# Patient Record
Sex: Male | Born: 1935 | Race: White | Hispanic: No | Marital: Married | Smoking: Former smoker
Health system: Southern US, Community
[De-identification: ages and names within clinical notes are randomized; demographics above are authoritative.]

## PROBLEM LIST (undated history)

## (undated) DIAGNOSIS — F0280 Dementia in other diseases classified elsewhere without behavioral disturbance: Secondary | ICD-10-CM

## (undated) DIAGNOSIS — F0390 Unspecified dementia without behavioral disturbance: Secondary | ICD-10-CM

## (undated) DIAGNOSIS — I82409 Acute embolism and thrombosis of unspecified deep veins of unspecified lower extremity: Secondary | ICD-10-CM

## (undated) DIAGNOSIS — I639 Cerebral infarction, unspecified: Secondary | ICD-10-CM

## (undated) DIAGNOSIS — C801 Malignant (primary) neoplasm, unspecified: Secondary | ICD-10-CM

## (undated) DIAGNOSIS — Z789 Other specified health status: Secondary | ICD-10-CM

## (undated) DIAGNOSIS — I10 Essential (primary) hypertension: Secondary | ICD-10-CM

## (undated) DIAGNOSIS — G309 Alzheimer's disease, unspecified: Secondary | ICD-10-CM

## (undated) HISTORY — PX: HERNIA REPAIR: SHX51

---

## 2003-11-13 ENCOUNTER — Other Ambulatory Visit: Payer: Self-pay

## 2003-11-15 ENCOUNTER — Other Ambulatory Visit: Payer: Self-pay

## 2004-01-20 ENCOUNTER — Ambulatory Visit: Payer: Self-pay | Admitting: Oncology

## 2004-02-16 ENCOUNTER — Ambulatory Visit: Payer: Self-pay | Admitting: Oncology

## 2004-02-20 ENCOUNTER — Ambulatory Visit: Payer: Self-pay | Admitting: Oncology

## 2004-03-21 ENCOUNTER — Ambulatory Visit: Payer: Self-pay | Admitting: Oncology

## 2004-04-21 ENCOUNTER — Ambulatory Visit: Payer: Self-pay | Admitting: Oncology

## 2004-05-22 ENCOUNTER — Ambulatory Visit: Payer: Self-pay | Admitting: Oncology

## 2004-05-31 ENCOUNTER — Ambulatory Visit: Payer: Self-pay | Admitting: Oncology

## 2004-09-04 ENCOUNTER — Ambulatory Visit: Payer: Self-pay | Admitting: Oncology

## 2004-09-19 ENCOUNTER — Ambulatory Visit: Payer: Self-pay | Admitting: Oncology

## 2004-10-02 ENCOUNTER — Other Ambulatory Visit: Payer: Self-pay

## 2004-10-02 ENCOUNTER — Emergency Department: Payer: Self-pay | Admitting: Unknown Physician Specialty

## 2004-12-04 ENCOUNTER — Ambulatory Visit: Payer: Self-pay | Admitting: Oncology

## 2004-12-20 ENCOUNTER — Ambulatory Visit: Payer: Self-pay | Admitting: Oncology

## 2005-03-07 ENCOUNTER — Ambulatory Visit: Payer: Self-pay | Admitting: Oncology

## 2005-06-26 ENCOUNTER — Ambulatory Visit: Payer: Self-pay | Admitting: Oncology

## 2005-07-20 ENCOUNTER — Ambulatory Visit: Payer: Self-pay | Admitting: Oncology

## 2005-10-14 ENCOUNTER — Encounter: Payer: Self-pay | Admitting: Internal Medicine

## 2005-10-15 ENCOUNTER — Ambulatory Visit: Payer: Self-pay | Admitting: Oncology

## 2005-10-19 ENCOUNTER — Encounter: Payer: Self-pay | Admitting: Internal Medicine

## 2005-11-07 ENCOUNTER — Ambulatory Visit: Payer: Self-pay | Admitting: Oncology

## 2005-11-19 ENCOUNTER — Encounter: Payer: Self-pay | Admitting: Internal Medicine

## 2005-12-04 ENCOUNTER — Ambulatory Visit: Payer: Self-pay | Admitting: Oncology

## 2005-12-26 ENCOUNTER — Ambulatory Visit: Payer: Self-pay | Admitting: Oncology

## 2006-01-19 ENCOUNTER — Ambulatory Visit: Payer: Self-pay | Admitting: Oncology

## 2006-05-29 ENCOUNTER — Ambulatory Visit: Payer: Self-pay | Admitting: Oncology

## 2006-06-20 ENCOUNTER — Ambulatory Visit: Payer: Self-pay | Admitting: Oncology

## 2006-08-12 ENCOUNTER — Ambulatory Visit: Payer: Self-pay | Admitting: Internal Medicine

## 2007-08-11 ENCOUNTER — Encounter: Payer: Self-pay | Admitting: Internal Medicine

## 2007-08-20 ENCOUNTER — Encounter: Payer: Self-pay | Admitting: Internal Medicine

## 2007-09-20 ENCOUNTER — Encounter: Payer: Self-pay | Admitting: Internal Medicine

## 2007-10-20 ENCOUNTER — Encounter: Payer: Self-pay | Admitting: Internal Medicine

## 2008-01-20 ENCOUNTER — Inpatient Hospital Stay: Payer: Self-pay | Admitting: Internal Medicine

## 2008-05-24 ENCOUNTER — Emergency Department: Payer: Self-pay | Admitting: Emergency Medicine

## 2009-01-07 ENCOUNTER — Emergency Department: Payer: Self-pay | Admitting: Unknown Physician Specialty

## 2009-09-21 ENCOUNTER — Ambulatory Visit: Payer: Self-pay | Admitting: Internal Medicine

## 2010-03-26 ENCOUNTER — Emergency Department: Payer: Self-pay | Admitting: Emergency Medicine

## 2010-04-19 ENCOUNTER — Inpatient Hospital Stay: Payer: Self-pay | Admitting: Internal Medicine

## 2010-09-20 ENCOUNTER — Inpatient Hospital Stay: Payer: Self-pay | Admitting: Internal Medicine

## 2010-11-20 ENCOUNTER — Inpatient Hospital Stay: Payer: Self-pay | Admitting: Internal Medicine

## 2011-03-04 ENCOUNTER — Inpatient Hospital Stay: Payer: Self-pay | Admitting: Internal Medicine

## 2011-05-26 ENCOUNTER — Emergency Department (HOSPITAL_COMMUNITY): Payer: Medicare Other

## 2011-05-26 ENCOUNTER — Inpatient Hospital Stay (HOSPITAL_COMMUNITY)
Admission: EM | Admit: 2011-05-26 | Discharge: 2011-05-27 | DRG: 640 | Disposition: A | Discharge status: Home / Self Care | Payer: Medicare Other | Attending: Internal Medicine | Admitting: Internal Medicine

## 2011-05-26 ENCOUNTER — Other Ambulatory Visit: Payer: Self-pay

## 2011-05-26 ENCOUNTER — Encounter (HOSPITAL_COMMUNITY): Payer: Self-pay | Admitting: Neurology

## 2011-05-26 DIAGNOSIS — Z993 Dependence on wheelchair: Secondary | ICD-10-CM

## 2011-05-26 DIAGNOSIS — F0390 Unspecified dementia without behavioral disturbance: Secondary | ICD-10-CM

## 2011-05-26 DIAGNOSIS — Z66 Do not resuscitate: Secondary | ICD-10-CM | POA: Diagnosis present

## 2011-05-26 DIAGNOSIS — E119 Type 2 diabetes mellitus without complications: Secondary | ICD-10-CM | POA: Diagnosis present

## 2011-05-26 DIAGNOSIS — I10 Essential (primary) hypertension: Secondary | ICD-10-CM | POA: Diagnosis present

## 2011-05-26 DIAGNOSIS — G92 Toxic encephalopathy: Secondary | ICD-10-CM | POA: Diagnosis present

## 2011-05-26 DIAGNOSIS — E860 Dehydration: Principal | ICD-10-CM | POA: Diagnosis present

## 2011-05-26 DIAGNOSIS — F0280 Dementia in other diseases classified elsewhere without behavioral disturbance: Secondary | ICD-10-CM | POA: Diagnosis present

## 2011-05-26 DIAGNOSIS — R4182 Altered mental status, unspecified: Secondary | ICD-10-CM | POA: Diagnosis present

## 2011-05-26 DIAGNOSIS — G309 Alzheimer's disease, unspecified: Secondary | ICD-10-CM | POA: Diagnosis present

## 2011-05-26 DIAGNOSIS — Z7401 Bed confinement status: Secondary | ICD-10-CM

## 2011-05-26 DIAGNOSIS — Z8673 Personal history of transient ischemic attack (TIA), and cerebral infarction without residual deficits: Secondary | ICD-10-CM

## 2011-05-26 DIAGNOSIS — Z87898 Personal history of other specified conditions: Secondary | ICD-10-CM

## 2011-05-26 DIAGNOSIS — G929 Unspecified toxic encephalopathy: Secondary | ICD-10-CM | POA: Diagnosis present

## 2011-05-26 HISTORY — DX: Alzheimer's disease, unspecified: G30.9

## 2011-05-26 HISTORY — DX: Other specified health status: Z78.9

## 2011-05-26 HISTORY — DX: Malignant (primary) neoplasm, unspecified: C80.1

## 2011-05-26 HISTORY — DX: Acute embolism and thrombosis of unspecified deep veins of unspecified lower extremity: I82.409

## 2011-05-26 HISTORY — DX: Cerebral infarction, unspecified: I63.9

## 2011-05-26 HISTORY — DX: Essential (primary) hypertension: I10

## 2011-05-26 HISTORY — DX: Unspecified dementia, unspecified severity, without behavioral disturbance, psychotic disturbance, mood disturbance, and anxiety: F03.90

## 2011-05-26 HISTORY — DX: Dementia in other diseases classified elsewhere, unspecified severity, without behavioral disturbance, psychotic disturbance, mood disturbance, and anxiety: F02.80

## 2011-05-26 LAB — COMPREHENSIVE METABOLIC PANEL
ALT: 12 U/L (ref 0–53)
Albumin: 3.2 g/dL — ABNORMAL LOW (ref 3.5–5.2)
Alkaline Phosphatase: 57 U/L (ref 39–117)
BUN: 29 mg/dL — ABNORMAL HIGH (ref 6–23)
Chloride: 106 mEq/L (ref 96–112)
Potassium: 3.4 mEq/L — ABNORMAL LOW (ref 3.5–5.1)
Sodium: 146 mEq/L — ABNORMAL HIGH (ref 135–145)
Total Bilirubin: 0.5 mg/dL (ref 0.3–1.2)
Total Protein: 6.1 g/dL (ref 6.0–8.3)

## 2011-05-26 LAB — CBC
HCT: 34.7 % — ABNORMAL LOW (ref 39.0–52.0)
Hemoglobin: 11.6 g/dL — ABNORMAL LOW (ref 13.0–17.0)
MCHC: 33.3 g/dL (ref 30.0–36.0)
MCHC: 32.9 g/dL (ref 30.0–36.0)
Platelets: 146 10*3/uL — ABNORMAL LOW (ref 150–400)
Platelets: 132 10*3/uL — ABNORMAL LOW (ref 150–400)
RBC: 3.56 MIL/uL — ABNORMAL LOW (ref 4.22–5.81)
RDW: 13.8 % (ref 11.5–15.5)
WBC: 7.8 10*3/uL (ref 4.0–10.5)

## 2011-05-26 LAB — GLUCOSE, CAPILLARY: Glucose-Capillary: 161 mg/dL — ABNORMAL HIGH (ref 70–99)

## 2011-05-26 LAB — CREATININE, SERUM
Creatinine, Ser: 1.32 mg/dL (ref 0.50–1.35)
GFR calc Af Amer: 53 mL/min — ABNORMAL LOW (ref >90)
GFR calc non Af Amer: 46 mL/min — ABNORMAL LOW (ref >90)

## 2011-05-26 LAB — URINALYSIS, ROUTINE W REFLEX MICROSCOPIC
Bilirubin Urine: NEGATIVE
Glucose, UA: NEGATIVE mg/dL
Ketones, ur: NEGATIVE mg/dL
Protein, ur: 30 mg/dL — AB
pH: 7.5 (ref 5.0–8.0)

## 2011-05-26 LAB — DIFFERENTIAL
Basophils Relative: 0 % (ref 0–1)
Eosinophils Absolute: 0.1 10*3/uL (ref 0.0–0.7)
Lymphs Abs: 0.9 10*3/uL (ref 0.7–4.0)
Monocytes Relative: 5 % (ref 3–12)
Neutro Abs: 8.3 10*3/uL — ABNORMAL HIGH (ref 1.7–7.7)
Neutrophils Relative %: 85 % — ABNORMAL HIGH (ref 43–77)

## 2011-05-26 LAB — URINE MICROSCOPIC-ADD ON

## 2011-05-26 MED ORDER — POTASSIUM CHLORIDE CRYS ER 10 MEQ PO TBCR
10.0000 meq | EXTENDED_RELEASE_TABLET | Freq: Every day | ORAL | Status: DC
  Filled 2011-05-26 (×2): qty 1

## 2011-05-26 MED ORDER — INSULIN ASPART 100 UNIT/ML ~~LOC~~ SOLN
0.0000 [IU] | Freq: Three times a day (TID) | SUBCUTANEOUS | Status: DC
  Filled 2011-05-26: qty 3

## 2011-05-26 MED ORDER — MEMANTINE HCL 10 MG PO TABS
10.0000 mg | ORAL_TABLET | Freq: Two times a day (BID) | ORAL | Status: DC
  Administered 2011-05-26: 10 mg via ORAL
  Filled 2011-05-26 (×3): qty 1

## 2011-05-26 MED ORDER — SODIUM CHLORIDE 0.9 % IV SOLN
INTRAVENOUS | Status: DC
  Administered 2011-05-26 – 2011-05-27 (×2): via INTRAVENOUS

## 2011-05-26 MED ORDER — ACETAMINOPHEN 650 MG RE SUPP: 650.0000 mg | Freq: Four times a day (QID) | RECTAL | Status: DC | PRN

## 2011-05-26 MED ORDER — ALBUTEROL SULFATE (5 MG/ML) 0.5% IN NEBU: 2.5000 mg | INHALATION_SOLUTION | RESPIRATORY_TRACT | Status: DC | PRN

## 2011-05-26 MED ORDER — ONDANSETRON HCL 4 MG/2ML IJ SOLN: 4.0000 mg | Freq: Four times a day (QID) | INTRAMUSCULAR | Status: DC | PRN

## 2011-05-26 MED ORDER — AMLODIPINE BESYLATE 10 MG PO TABS
10.0000 mg | ORAL_TABLET | Freq: Every day | ORAL | Status: DC
  Administered 2011-05-26: 10 mg via ORAL
  Filled 2011-05-26 (×2): qty 1

## 2011-05-26 MED ORDER — ENOXAPARIN SODIUM 40 MG/0.4ML ~~LOC~~ SOLN
40.0000 mg | SUBCUTANEOUS | Status: DC
  Administered 2011-05-26: 40 mg via SUBCUTANEOUS
  Filled 2011-05-26 (×2): qty 0.4

## 2011-05-26 MED ORDER — ONDANSETRON HCL 4 MG PO TABS: 4.0000 mg | ORAL_TABLET | Freq: Four times a day (QID) | ORAL | Status: DC | PRN

## 2011-05-26 MED ORDER — ACETAMINOPHEN 325 MG PO TABS: 650.0000 mg | ORAL_TABLET | Freq: Four times a day (QID) | ORAL | Status: DC | PRN

## 2011-05-26 MED ORDER — SODIUM CHLORIDE 0.9 % IV SOLN
Freq: Once | INTRAVENOUS | Status: AC
  Administered 2011-05-26: 11:00:00 via INTRAVENOUS

## 2011-05-26 MED ORDER — SODIUM CHLORIDE 0.9 % IV SOLN: INTRAVENOUS | Status: DC

## 2011-05-26 MED ORDER — OXYCODONE HCL 5 MG PO TABS: 5.0000 mg | ORAL_TABLET | ORAL | Status: DC | PRN

## 2011-05-26 MED ORDER — GLIPIZIDE ER 2.5 MG PO TB24
2.5000 mg | ORAL_TABLET | Freq: Every day | ORAL | Status: DC
  Filled 2011-05-26 (×2): qty 1

## 2011-05-26 MED ORDER — METOPROLOL TARTRATE 50 MG PO TABS
50.0000 mg | ORAL_TABLET | Freq: Every day | ORAL | Status: DC
  Administered 2011-05-26: 50 mg via ORAL
  Filled 2011-05-26 (×2): qty 1

## 2011-05-26 NOTE — ED Notes (Signed)
Family at bedside. Unable to do neuro assessment on patient due hx dementia/alz. Pt opens eyes to verbal stimuli. Pt withdrawals from pain. Lung sounds clear. Abdomen soft, non-distended. Skin warm and dry. EMS reporting pt having diarrhea, pt clean and dry during assessment. Pt noted to be hypertensive. EDP notified.

## 2011-05-26 NOTE — Progress Notes (Signed)
Utilization Review Completed.Ketzia Guzek T2/07/2011   

## 2011-05-26 NOTE — ED Provider Notes (Addendum)
History     CSN: [id number]  Arrival date & time 05/26/11  0903   First MD Initiated Contact with Patient 05/26/11 [phone]      Chief Complaint  Patient presents with  . Altered Mental Status    (Consider location/radiation/quality/duration/timing/severity/associated sxs/prior treatment) HPI Comments: Patient found by family to be less responsive, non-verbal this morning.  It is reported to me by EMS that he has had diarrhea for the past several days.  A Level 5 caveat applies due to severity of condition and mental status.  Patient is a 78 y.o. male presenting with altered mental status. The history is provided by the patient.  Altered Mental Status This is a new problem. The current episode started 3 to 5 hours ago. The problem occurs constantly. The problem has not changed since onset.   Past Medical History  Diagnosis Date  . Diabetes mellitus   . Dementia   . Alzheimer disease   . Hypertension     History reviewed. No pertinent past surgical history.  No family history on file.  History  Substance Use Topics  . Smoking status: Never Smoker   . Smokeless tobacco: Not on file  . Alcohol Use: No      Review of Systems  Unable to perform ROS Psychiatric/Behavioral: Positive for altered mental status.    Allergies  Review of patient's allergies indicates no known allergies.  Home Medications   Current Outpatient Rx  Name Route Sig Dispense Refill  . ACETAMINOPHEN 325 MG PO TABS Oral Take 650 mg by mouth every 4 (four) hours as needed. For pain    . AMLODIPINE BESYLATE 10 MG PO TABS Oral Take 10 mg by mouth every morning.    Marland Kitchen GLIPIZIDE ER 2.5 MG PO TB24 Oral Take 2.5 mg by mouth every morning.    Marland Kitchen MEMANTINE HCL 10 MG PO TABS Oral Take 10 mg by mouth 2 (two) times daily.    Marland Kitchen METOPROLOL TARTRATE 50 MG PO TABS Oral Take 50 mg by mouth every morning.    Marland Kitchen POTASSIUM CHLORIDE CRYS ER 10 MEQ PO TBCR Oral Take 10 mEq by mouth daily.      BP 206/105  Pulse 84   Temp(Src) 97.7 F (36.5 C) (Rectal)  Resp 18  SpO2 95%  Physical Exam  Nursing note and vitals reviewed. Constitutional:       Elderly male, decreased responsiveness, in no acute distress.    HENT:  Head: Normocephalic and atraumatic.  Eyes: Pupils are equal, round, and reactive to light.  Neck: Normal range of motion.  Cardiovascular: Normal rate and regular rhythm.   No murmur heard. Pulmonary/Chest: Effort normal. No respiratory distress. He has no wheezes.  Abdominal: Soft. Bowel sounds are normal. He exhibits no distension. There is no tenderness.  Musculoskeletal: Normal range of motion. He exhibits no edema.  Neurological:       Patient will not respond to voice or follow commands.  He appears comfortable and is protecting his airway.  Otherwise, neuro exam is difficult to assess.  Skin: Skin is warm and dry.    ED Course  Procedures (including critical care time)  Labs Reviewed - No data to display No results found.   No diagnosis found.   Date: 05/26/2011  Rate: 80  Rhythm: normal sinus rhythm  QRS Axis: left  Intervals: normal  ST/T Wave abnormalities: nonspecific ST changes  Conduction Disutrbances:nonspecific intraventricular conduction delay  Narrative Interpretation:   Old EKG Reviewed: none available  MDM  Ct of the head looks okay.  Labs reflect dehydration which correlates with the reported history of diarrhea for the past 5 days.  Will consult medicine for admission.        Geoffery Lyons, MD 05/26/11 1240  Geoffery Lyons, MD 05/26/11 [phone]

## 2011-05-26 NOTE — ED Notes (Signed)
Report given waiting for admitting orders

## 2011-05-26 NOTE — ED Notes (Signed)
Per ems-pt lives at home with wife is bedbound. Pt has dementia/alzhe. Pt reported baseline is verbal responsive. At this time pt responding to stimulation. Pt not responding verbally. When EMS arrived pt presenting with nystagmus. Family reporting diarrhea, x last 36 hours. EMS unable to do neuro assessment due to pt not following commands. Pt hypertensive 206/105.

## 2011-05-26 NOTE — Progress Notes (Signed)
Physical Therapy Evaluation Patient Details Name: Jared Miranda MRN: [id number] DOB: 04/30/1918 Today's Date: 05/26/2011  Problem List:  Patient Active Problem List  Diagnoses  . Mental status change  . Dehydration  . HTN (hypertension)  . Dementia    Past Medical History:  Past Medical History  Diagnosis Date  . Diabetes mellitus   . Dementia   . Alzheimer disease   . Hypertension   . Cancer     lymphoma  . Stroke   . No pertinent past medical history    Past Surgical History:  Past Surgical History  Procedure Date  . Hernia repair     PT Assessment/Plan/Recommendation PT Assessment Clinical Impression Statement:   78 yo male admitted with AMS/Dehydration presents to PT with decreased functional mobility, and likely functional decline as he was able to transfer to wheelchair with caregiver assist PTA (per RN);   May benefit from acute PT services to maximize functional mobility and facilitate dc planning;   While pt's own home environment is typically the most therapeutic place for pt's with dementia, if he doesn't have adequate assist, we may need to consider SNF;   Hopeful for pt to return to  baseline fucntional status, and be able to return home with 24 hour assist; will need more info regarding home equipment, setup, and exactly how much assist is available to pt PT Recommendation/Assessment: Patient will need skilled PT in the acute care venue PT Problem List: Decreased strength;Decreased range of motion;Decreased activity tolerance;Decreased balance;Decreased mobility;Decreased coordination;Decreased cognition;Decreased safety awareness;Decreased knowledge of precautions Barriers to Discharge:  (Need more info) PT Therapy Diagnosis : Generalized weakness;Altered mental status PT Plan PT Frequency: Min 2X/week (frequency can be adjusted pending ability to participate) PT Treatment/Interventions: DME instruction;Functional mobility training;Therapeutic  activities;Therapeutic exercise;Balance training;Neuromuscular re-education;Cognitive remediation;Patient/family education;Wheelchair mobility training PT Recommendation Recommendations for Other Services: Other (comment) (Is it worth considering Palliative Care Medicine consult?) Follow Up Recommendations: Skilled nursing facility;Home health PT;Supervision/Assistance - 24 hour (SNF versus home) Equipment Recommended:  [facility] bed; ambulance transport home) PT Goals  Acute Rehab PT Goals PT Goal Formulation: Patient unable to participate in goal setting Time For Goal Achievement: 2 weeks;Other (comment) (PT trial depending on ability to participate) Pt will Roll Supine to Right Side: with mod assist PT Goal: Rolling Supine to Right Side - Progress: Goal set today Pt will Roll Supine to Left Side: with mod assist PT Goal: Rolling Supine to Left Side - Progress: Goal set today Pt will go Supine/Side to Sit: with mod assist PT Goal: Supine/Side to Sit - Progress: Goal set today Pt will Sit at Edge of Bed: with mod assist PT Goal: Sit at Edge Of Bed - Progress: Goal set today Pt will go Sit to Supine/Side: with mod assist PT Goal: Sit to Supine/Side - Progress: Goal set today Pt will Transfer Bed to Chair/Chair to Bed: with mod assist PT Transfer Goal: Bed to Chair/Chair to Bed - Progress: Goal set today  PT Evaluation Precautions/Restrictions  Precautions Precautions: Fall Prior Functioning  Home Living Lives With: Spouse;Other (Comment) (Caregivers) Receives Help From: Family;Personal care attendant (per RN has caregivers 24hr) Type of Home: House Home Adaptive Equipment:  (Wheelchair) Additional Comments: Will need more info re: home setup, equipment, and exactly how much assist pt and spouse have Prior Function Level of Independence: Needs assistance with tranfers;Needs assistance with ADLs Cognition Cognition Arousal/Alertness: Lethargic Overall Cognitive Status: History of  cognitive impairments (with history of dementia) History of Cognitive Impairment: Decline in baseline functioning (  per RN pt was verbal at home PTA) Attention: Impaired Current Attention Level: Focused Orientation Level: Disoriented X4 Following Commands: Other (comment) (Unable to follow simple commands) Cognition - Other Comments: Pt with increased arousal, and eyes open once upright sitting and bed mobility initiated; RN was able to give pt crushed meds in applesauce once pt was more awake Sensation/Coordination Sensation Light Touch: Not tested Coordination Gross Motor Movements are Fluid and Coordinated: No Fine Motor Movements are Fluid and Coordinated: No Coordination and Movement Description: slow moving; overal trunk and hip extensor push Extremity Assessment RUE Assessment RUE Assessment: Exceptions to [facility] RUE Strength RUE Overall Strength Comments: difficult to assess secondary to not following commands LUE Assessment LUE Assessment: Exceptions to [facility] LUE Strength LUE Overall Strength Comments: difficult to assess secondary to not following commands RLE Assessment RLE Assessment: Exceptions to [facility] RLE Strength RLE Overall Strength Comments: difficult to assess secondary to not following commands LLE Assessment LLE Assessment: Exceptions to [facility] LLE Strength LLE Overall Strength Comments: difficult to assess secondary to not following commands Mobility (including Balance) Bed Mobility Bed Mobility: Yes Supine to Sit: 1: +1 Total assist;HOB elevated (Comment degrees) Supine to Sit Details (indicate cue type and reason): Initated move to EOB from head elevated/near sitting position and rotated trunk to clear feet from bed Sitting - Scoot to Edge of Bed: 1: +1 Total assist Sitting - Scoot to Edge of Bed Details (indicate cue type and reason): Used bed pad to slide hips forward for EOB sitting Sit to Supine: 1: +2 Total assist;Patient percentage (comment) (pt<10%) Sit to  Supine - Details (indicate cue type and reason): Required physical assist to control trunk lowering and LEs raising onto bed Transfers Transfers: No Ambulation/Gait Ambulation/Gait: No Stairs: No Wheelchair Mobility Wheelchair Mobility: No  Posture/Postural Control Posture/Postural Control: Postural limitations Postural Limitations: Significant posterior lean at EOB Balance Balance Assessed: Yes Static Sitting Balance Static Sitting - Balance Support: Feet supported;Bilateral upper extremity supported (pt inconsistently supported self with R and L UEs) Static Sitting - Level of Assistance: 2: Max assist;1: +1 Total assist Static Sitting - Comment/# of Minutes: Up to total assist to maintain sitting EOB secondary to increasing posterior lean with increased time at EOB End of Session PT - End of Session Equipment Utilized During Treatment:  (bed pad) Activity Tolerance: Other (comment) (non-verbal, but increased time with eyes open in sitting) Patient left: in bed;with call bell in reach (With RN and Nurse tech) Nurse Communication: Mobility status for transfers General Behavior During Session: Lethargic Cognition: Impaired Cognitive Impairment: If pt was in fact verbally responsive PTA, then there is a decline from baseline of functioning  Olen Pel [place], [place] [phone] 05/26/2011, 5:16 PM

## 2011-05-26 NOTE — Plan of Care (Signed)
Problem: Phase I Progression Outcomes Goal: Pain controlled with appropriate interventions Outcome: Not Applicable Date Met:  05/26/11 Pt is bedbound, not indicated

## 2011-05-26 NOTE — H&P (Addendum)
PATIENT DETAILS Name: Jared Miranda Age: 78 y.o. Sex: male Date of Birth: December 04, 1918 Admit Date: 05/26/2011 PCP:No primary provider on file.   CHIEF COMPLAINT:  Altered mental status  HPI: Patient is a 78 year old caucasian male with a past medical history with advanced dementia, mostly bed bound, who lives with his wife who is also wheelchair bound having diarrhea over the weekend, was found lethargic this am, and subsequently brought over to the ED for further evaluation. No fever noted. All of the history is obtained from the patient's son at bedside. No cough noted. Per Patient's son, at baseline, mostly quiet and lies in bed the whole day, at times speaks.  During my evaluation, patient was easily arousable, withdraws all of his extremities to pain, and actually was at one point agitated and abusive when painful stimuli was applied. His speech was clear at that point.  He is now being admitted to the hospital for further evaluation, son indicates to me that no heroic measures are desired.   ALLERGIES:  No Known Allergies  PAST MEDICAL HISTORY: Past Medical History  Diagnosis Date  . Diabetes mellitus   . Dementia   . Alzheimer disease   . Hypertension   . Cancer     lymphoma  . Stroke   . No pertinent past medical history     PAST SURGICAL HISTORY: Past Surgical History  Procedure Date  . Hernia repair     MEDICATIONS AT HOME: Prior to Admission medications   Medication Sig Start Date End Date Taking? Authorizing Provider  acetaminophen (TYLENOL) 325 MG tablet Take 650 mg by mouth every 4 (four) hours as needed. For pain   Yes Historical Provider, MD  amLODipine (NORVASC) 10 MG tablet Take 10 mg by mouth every morning.   Yes Historical Provider, MD  glipiZIDE (GLUCOTROL XL) 2.5 MG 24 hr tablet Take 2.5 mg by mouth every morning.   Yes Historical Provider, MD  memantine (NAMENDA) 10 MG tablet Take 10 mg by mouth 2 (two) times daily.   Yes Historical Provider, MD    metoprolol (LOPRESSOR) 50 MG tablet Take 50 mg by mouth every morning.   Yes Historical Provider, MD  potassium chloride (K-DUR,KLOR-CON) 10 MEQ tablet Take 10 mEq by mouth daily.   Yes Historical Provider, MD    FAMILY HISTORY: No family history on file.  SOCIAL HISTORY:  reports that he has quit smoking. His smoking use included Cigars and Pipe. He does not have any smokeless tobacco history on file. He reports that he drinks alcohol. He reports that he does not use illicit drugs.  REVIEW OF SYSTEMS:  Constitutional:   No  weight loss, night sweats,  Fevers, chills, fatigue.  HEENT:    No headaches, Difficulty swallowing,Tooth/dental problems,Sore throat,  No sneezing, itching, ear ache, nasal congestion, post nasal drip,   Cardio-vascular: No chest pain,  Orthopnea, PND, swelling in lower extremities, anasarca,         dizziness, palpitations  GI:  No heartburn, indigestion, abdominal pain, nausea, vomiting, diarrhea, change in       bowel habits, loss of appetite  Resp: No shortness of breath with exertion or at rest.  No excess mucus, no productive cough, No non-productive cough,  No coughing up of blood.No change in color of mucus.No wheezing.No chest wall deformity  Skin:  no rash or lesions.  GU:  no dysuria, change in color of urine, no urgency or frequency.  No flank pain.  Musculoskeletal: No joint pain  or swelling.  No decreased range of motion.  No back pain.  Psych: No change in mood or affect. No depression or anxiety.  No memory loss.   PHYSICAL EXAM: Blood pressure 160/95, pulse 73, temperature 97.7 F (36.5 C), temperature source Rectal, resp. rate 15, SpO2 99.00%.  General appearance :Awake, minimally verbal.  HEENT: Atraumatic and Normocephalic, pupils equally reactive to light and accomodation Neck: hard to evaluate if supple, as patient is fighting me, but no meningeal signs present Chest:Good air entry bilaterally, no added sounds  CVS: S1 S2  regular, no murmurs.  Abdomen: Bowel sounds present, Non tender and not distended with no gaurding, rigidity or rebound. Extremities: B/L Lower Ext shows no edema, both legs are warm to touch, with  dorsalis pedis pulses palpable. Neurology:  Non focal, Skin:No Rash Wounds:N/A  LABS ON ADMISSION:   Basename 05/26/11 1520 05/26/11 1128  NA -- 146*  K -- 3.4*  CL -- 106  CO2 -- 32  GLUCOSE -- 104*  BUN -- 29*  CREATININE 1.32 1.42*  CALCIUM -- 8.9  MG -- --  PHOS -- --    Basename 05/26/11 1128  AST 16  ALT 12  ALKPHOS 57  BILITOT 0.5  PROT 6.1  ALBUMIN 3.2*   No results found for this basename: LIPASE:2,AMYLASE:2 in the last 72 hours  Basename 05/26/11 1520 05/26/11 1128  WBC 7.8 9.8  NEUTROABS -- 8.3*  HGB 11.4* 11.6*  HCT 34.7* 34.8*  MCV 97.5 97.8  PLT 132* 146*   No results found for this basename: CKTOTAL:3,CKMB:3,CKMBINDEX:3,TROPONINI:3 in the last 72 hours No results found for this basename: DDIMER:2 in the last 72 hours No components found with this basename: POCBNP:3   RADIOLOGIC STUDIES ON ADMISSION: Ct Head Wo Contrast  05/26/2011  *RADIOLOGY REPORT*  Clinical Data: Altered mental status  CT HEAD WITHOUT CONTRAST  Technique:  Contiguous axial images were obtained from the base of the skull through the vertex without contrast.  Comparison: None.  Findings: Moderate to advanced generalized atrophy with prominence of the ventricles and subarachnoid space.  Extensive chronic ischemia in the cerebral white matter bilaterally.  Chronic infarct left temporal lobe inferiorly.  No acute infarct.  Negative for hemorrhage or mass.  Chronic infarct left cerebellum.  Calvarium is intact.  IMPRESSION: Atrophy and extensive chronic   ischemia.  No acute infarct.  Original Report Authenticated By: Camelia Phenes, M.D.   Dg Chest Port 1 View  05/26/2011  *RADIOLOGY REPORT*  Clinical Data: Decreased level of consciousness, history diabetes, dementia/Alzheimer's, hypertension   PORTABLE CHEST - 1 VIEW  Comparison: Portable exam 1345 hours without priors for comparison.  Findings: Enlargement of cardiac silhouette with pulmonary vascular congestion. Probable bilateral layered pleural effusions. Atelectasis versus consolidation left lower lobe. Potential mild atelectasis right base as well. No pneumothorax. Bones demineralized.  IMPRESSION: Enlargement of cardiac silhouette with pulmonary vascular congestion. Bibasilar effusions with atelectasis versus consolidation in left lower lobe and probable mild right basilar atelectasis.  Original Report Authenticated By: Lollie Marrow, M.D.    ASSESSMENT AND PLAN: Present on Admission:  .Mental status change ?toxic metabolic encephalopathy vs delirium -check Cdiff -since apart from diarrhea no other foci of infection evident and patient afebrile, will hold off on starting antibitics -will gently hydrate, and see how his mental status evolves.  .Dehydration -gently IV hydration -speech eval in am  .HTN (hypertension) -continue with metoprolol and amlodipine  DM -continue with SSI and Glipizide  Further plan will depend as  patient's clinical course evolves and further radiologic and laboratory data become available. Patient will be monitored closely.   DVT Prophylaxis: Lovenox  Code Status: DNR  Total time spent for admission equals 45 minutes.  Jeoffrey Massed 05/26/2011, 4:55 PM

## 2011-05-27 LAB — COMPREHENSIVE METABOLIC PANEL
Albumin: 3 g/dL — ABNORMAL LOW (ref 3.5–5.2)
Alkaline Phosphatase: 59 U/L (ref 39–117)
BUN: 26 mg/dL — ABNORMAL HIGH (ref 6–23)
Calcium: 8.7 mg/dL (ref 8.4–10.5)
Creatinine, Ser: 1.18 mg/dL (ref 0.50–1.35)
GFR calc Af Amer: 61 mL/min — ABNORMAL LOW (ref >90)
Glucose, Bld: 92 mg/dL (ref 70–99)
Potassium: 3.1 mEq/L — ABNORMAL LOW (ref 3.5–5.1)
Total Protein: 5.9 g/dL — ABNORMAL LOW (ref 6.0–8.3)

## 2011-05-27 LAB — GLUCOSE, CAPILLARY: Glucose-Capillary: 83 mg/dL (ref 70–99)

## 2011-05-27 MED ORDER — METOPROLOL TARTRATE 1 MG/ML IV SOLN
5.0000 mg | Freq: Once | INTRAVENOUS | Status: AC
  Administered 2011-05-27: 5 mg via INTRAVENOUS
  Filled 2011-05-27: qty 5

## 2011-05-27 NOTE — Progress Notes (Signed)
Speech Language/Pathology Pts son and CNA in room, they report he drinks nectar thick liquids at home due to dementia, tolerates very well. Pt is going to d/c home very soon. SLP will defer full eval and change hospital diet to nectar thick liquids. Please reorder if needed. Harlon Ditty, MA CCC-SLP [phone]

## 2011-05-27 NOTE — Discharge Summary (Signed)
PATIENT DETAILS Name: Jared Miranda Age: 78 y.o. Sex: male Date of Birth: 24-Sep-1918 MRN: [id number]. Admit Date: 05/26/2011 Admitting Physician: Jared Massed, MD PCP:No primary provider on file.  PRIMARY DISCHARGE DIAGNOSIS:  Principal Problem:  *Mental status change Active Problems:  Dehydration  HTN (hypertension)  Dementia      PAST MEDICAL HISTORY: Past Medical History  Diagnosis Date  . Diabetes mellitus   . Dementia   . Alzheimer disease   . Hypertension   . Cancer     lymphoma  . Stroke   . No pertinent past medical history   . DVT (deep venous thrombosis)     DISCHARGE MEDICATIONS: Medication List  As of 05/27/2011  1:06 PM   TAKE these medications         acetaminophen 325 MG tablet   Commonly known as: TYLENOL   Take 650 mg by mouth every 4 (four) hours as needed. For pain      amLODipine 10 MG tablet   Commonly known as: NORVASC   Take 10 mg by mouth every morning.      glipiZIDE 2.5 MG 24 hr tablet   Commonly known as: GLUCOTROL XL   Take 2.5 mg by mouth every morning.      memantine 10 MG tablet   Commonly known as: NAMENDA   Take 10 mg by mouth 2 (two) times daily.      metoprolol 50 MG tablet   Commonly known as: LOPRESSOR   Take 50 mg by mouth every morning.      potassium chloride 10 MEQ tablet   Commonly known as: K-DUR,KLOR-CON   Take 10 mEq by mouth daily.             BRIEF HPI:  See H&P, Labs, Consult and Test reports for all details in brief,Patient is a 78 year old caucasian male with a past medical history with advanced dementia, mostly bed bound, who lives with his wife who is also wheelchair bound having diarrhea over the weekend, was found lethargic this am, and subsequently brought over to the ED for further evaluation   CONSULTATIONS:   None  PERTINENT RADIOLOGIC STUDIES: Ct Head Wo Contrast  05/26/2011  *RADIOLOGY REPORT*  Clinical Data: Altered mental status  CT HEAD WITHOUT CONTRAST  Technique:  Contiguous  axial images were obtained from the base of the skull through the vertex without contrast.  Comparison: None.  Findings: Moderate to advanced generalized atrophy with prominence of the ventricles and subarachnoid space.  Extensive chronic ischemia in the cerebral white matter bilaterally.  Chronic infarct left temporal lobe inferiorly.  No acute infarct.  Negative for hemorrhage or mass.  Chronic infarct left cerebellum.  Calvarium is intact.  IMPRESSION: Atrophy and extensive chronic   ischemia.  No acute infarct.  Original Report Authenticated By: Camelia Phenes, M.D.   Dg Chest Port 1 View  05/26/2011  *RADIOLOGY REPORT*  Clinical Data: Decreased level of consciousness, history diabetes, dementia/Alzheimer's, hypertension  PORTABLE CHEST - 1 VIEW  Comparison: Portable exam 1345 hours without priors for comparison.  Findings: Enlargement of cardiac silhouette with pulmonary vascular congestion. Probable bilateral layered pleural effusions. Atelectasis versus consolidation left lower lobe. Potential mild atelectasis right base as well. No pneumothorax. Bones demineralized.  IMPRESSION: Enlargement of cardiac silhouette with pulmonary vascular congestion. Bibasilar effusions with atelectasis versus consolidation in left lower lobe and probable mild right basilar atelectasis.  Original Report Authenticated By: Lollie Marrow, M.D.     PERTINENT LAB RESULTS: CBC:  Basename 05/26/11 1520 05/26/11 1128  WBC 7.8 9.8  HGB 11.4* 11.6*  HCT 34.7* 34.8*  PLT 132* 146*   CMET CMP     Component Value Date/Time   NA 146* 05/27/2011 0520   K 3.1* 05/27/2011 0520   CL 107 05/27/2011 0520   CO2 32 05/27/2011 0520   GLUCOSE 92 05/27/2011 0520   BUN 26* 05/27/2011 0520   CREATININE 1.18 05/27/2011 0520   CALCIUM 8.7 05/27/2011 0520   PROT 5.9* 05/27/2011 0520   ALBUMIN 3.0* 05/27/2011 0520   AST 13 05/27/2011 0520   ALT 11 05/27/2011 0520   ALKPHOS 59 05/27/2011 0520   BILITOT 0.5 05/27/2011 0520   GFRNONAA 52* 05/27/2011 0520    GFRAA 61* 05/27/2011 0520    GFR CrCl is unknown because there is no height on file for the current visit. No results found for this basename: LIPASE:2,AMYLASE:2 in the last 72 hours No results found for this basename: CKTOTAL:3,CKMB:3,CKMBINDEX:3,TROPONINI:3 in the last 72 hours No components found with this basename: POCBNP:3 No results found for this basename: DDIMER:2 in the last 72 hours No results found for this basename: HGBA1C:2 in the last 72 hours No results found for this basename: CHOL:2,HDL:2,LDLCALC:2,TRIG:2,CHOLHDL:2,LDLDIRECT:2 in the last 72 hours No results found for this basename: TSH,T4TOTAL,FREET3,T3FREE,THYROIDAB in the last 72 hours No results found for this basename: VITAMINB12:2,FOLATE:2,FERRITIN:2,TIBC:2,IRON:2,RETICCTPCT:2 in the last 72 hours Coags: No results found for this basename: PT:2,INR:2 in the last 72 hours Microbiology: No results found for this or any previous visit (from the past 240 hour(s)).   BRIEF HOSPITAL COURSE:   Principal Problem:  *Mental status change - likely secondary to dehydration -diarrhea resolved, was not noted to have diarrhea overnight -this morning on day of discharge, patient more awake, conversant (confused), eating breakfast-son at bedside feeding. -family was very anxious to take him home, they did not want further investigations, given his advanced dementia, I agree that further work up will not change overall management.Per my conversation with family yesterday they did not want any heroic measures or aggressive work up.  Active Problems:  Dehydration - due to diarrhea -resolved with IVF   HTN (hypertension) -continue with prior outpatient meds   Dementia -advanced -per son at bedside patient appears very close to baseline this am     TODAY-DAY OF DISCHARGE:  Subjective:   Jared Miranda today is much more awake, and is pleasantly confused but conversant. His son at bedside claims that he is very close to  baseline and is anxious to take him back home.  Objective:   Blood pressure 172/58, pulse 54, temperature 97.6 F (36.4 C), temperature source Rectal, resp. rate 18, SpO2 94.00%. No intake or output data in the 24 hours ending 05/27/11 1306  Exam Awake Alert, Oriented *3, No new F.N deficits, Normal affect [place].AT,PERRAL Supple Neck,No JVD, No cervical lymphadenopathy appriciated.  Symmetrical Chest wall movement, Good air movement bilaterally, CTAB RRR,No Gallops,Rubs or new Murmurs, No Parasternal Heave +ve B.Sounds, Abd Soft, Non tender, No organomegaly appriciated, No rebound -guarding or rigidity. No Cyanosis, Clubbing or edema, No new Rash or bruise  DISPOSITION: Home at family's request   DISCHARGE INSTRUCTIONS:    Follow up with PCP in 1 week.   Total Time spent on discharge equals 45 minutes.  SignedJeoffrey Miranda 05/27/2011 1:06 PM

## 2011-06-11 ENCOUNTER — Emergency Department: Payer: Self-pay

## 2011-06-11 LAB — COMPREHENSIVE METABOLIC PANEL
Anion Gap: 9 (ref 7–16)
Bilirubin,Total: 0.4 mg/dL (ref 0.2–1.0)
Calcium, Total: 8.2 mg/dL — ABNORMAL LOW (ref 8.5–10.1)
Chloride: 105 mmol/L (ref 98–107)
Co2: 33 mmol/L — ABNORMAL HIGH (ref 21–32)
Creatinine: 1.43 mg/dL — ABNORMAL HIGH (ref 0.60–1.30)
EGFR (African American): 60 — ABNORMAL LOW
Osmolality: 298 (ref 275–301)
Potassium: 3.6 mmol/L (ref 3.5–5.1)
Sodium: 147 mmol/L — ABNORMAL HIGH (ref 136–145)

## 2011-06-11 LAB — CBC
MCH: 33 pg (ref 26.0–34.0)
MCV: 98 fL (ref 80–100)
Platelet: 184 10*3/uL (ref 150–440)
RBC: 3.44 10*6/uL — ABNORMAL LOW (ref 4.40–5.90)
WBC: 7.8 10*3/uL (ref 3.8–10.6)

## 2011-06-11 LAB — URINALYSIS, COMPLETE
Bacteria: NONE SEEN
Blood: NEGATIVE
Glucose,UR: NEGATIVE mg/dL (ref 0–75)
Nitrite: NEGATIVE
Ph: 6 (ref 4.5–8.0)
Protein: 30

## 2011-06-11 LAB — TROPONIN I: Troponin-I: 0.09 ng/mL — ABNORMAL HIGH

## 2011-06-12 ENCOUNTER — Inpatient Hospital Stay: Payer: Self-pay | Admitting: Internal Medicine

## 2011-06-12 LAB — CBC WITH DIFFERENTIAL/PLATELET
Basophil #: 0 10*3/uL (ref 0.0–0.1)
Basophil %: 0.4 %
Eosinophil #: 0.1 10*3/uL (ref 0.0–0.7)
HCT: 33.9 % — ABNORMAL LOW (ref 40.0–52.0)
HGB: 11.3 g/dL — ABNORMAL LOW (ref 13.0–18.0)
Lymphocyte #: 0.4 10*3/uL — ABNORMAL LOW (ref 1.0–3.6)
MCHC: 33.2 g/dL (ref 32.0–36.0)
MCV: 98 fL (ref 80–100)
Monocyte %: 3.3 %
Neutrophil #: 10 10*3/uL — ABNORMAL HIGH (ref 1.4–6.5)
RDW: 12.9 % (ref 11.5–14.5)

## 2011-06-12 LAB — COMPREHENSIVE METABOLIC PANEL
Albumin: 3 g/dL — ABNORMAL LOW (ref 3.4–5.0)
Alkaline Phosphatase: 50 U/L (ref 50–136)
BUN: 24 mg/dL — ABNORMAL HIGH (ref 7–18)
Bilirubin,Total: 0.5 mg/dL (ref 0.2–1.0)
Chloride: 105 mmol/L (ref 98–107)
Co2: 31 mmol/L (ref 21–32)
Creatinine: 1.49 mg/dL — ABNORMAL HIGH (ref 0.60–1.30)
Potassium: 3 mmol/L — ABNORMAL LOW (ref 3.5–5.1)

## 2011-06-12 LAB — TROPONIN I: Troponin-I: 0.08 ng/mL — ABNORMAL HIGH

## 2011-06-12 LAB — CK TOTAL AND CKMB (NOT AT ARMC): CK-MB: 1.6 ng/mL (ref 0.5–3.6)

## 2011-06-13 LAB — CBC WITH DIFFERENTIAL/PLATELET
Basophil #: 0 10*3/uL (ref 0.0–0.1)
Eosinophil %: 0.8 %
HCT: 27.3 % — ABNORMAL LOW (ref 40.0–52.0)
HGB: 9.2 g/dL — ABNORMAL LOW (ref 13.0–18.0)
Lymphocyte #: 0.8 10*3/uL — ABNORMAL LOW (ref 1.0–3.6)
MCHC: 33.7 g/dL (ref 32.0–36.0)
MCV: 98 fL (ref 80–100)
Monocyte #: 0.9 10*3/uL — ABNORMAL HIGH (ref 0.0–0.7)
Monocyte %: 6.4 %
Neutrophil %: 87.3 %
Platelet: 147 10*3/uL — ABNORMAL LOW (ref 150–440)
RDW: 14.2 % (ref 11.5–14.5)

## 2011-06-13 LAB — BASIC METABOLIC PANEL
Anion Gap: 11 (ref 7–16)
BUN: 26 mg/dL — ABNORMAL HIGH (ref 7–18)
Calcium, Total: 8 mg/dL — ABNORMAL LOW (ref 8.5–10.1)
Co2: 30 mmol/L (ref 21–32)
EGFR (African American): 52 — ABNORMAL LOW
Glucose: 98 mg/dL (ref 65–99)

## 2011-06-14 LAB — BASIC METABOLIC PANEL
Anion Gap: 12 (ref 7–16)
Chloride: 108 mmol/L — ABNORMAL HIGH (ref 98–107)
Co2: 28 mmol/L (ref 21–32)
Creatinine: 1.65 mg/dL — ABNORMAL HIGH (ref 0.60–1.30)

## 2011-06-14 LAB — CBC WITH DIFFERENTIAL/PLATELET
Eosinophil #: 0.3 10*3/uL (ref 0.0–0.7)
Lymphocyte %: 6.2 %
MCH: 33 pg (ref 26.0–34.0)
MCHC: 33.3 g/dL (ref 32.0–36.0)
Monocyte #: 0.9 10*3/uL — ABNORMAL HIGH (ref 0.0–0.7)
Neutrophil %: 83.2 %
Platelet: 140 10*3/uL — ABNORMAL LOW (ref 150–440)
RDW: 14.3 % (ref 11.5–14.5)

## 2011-06-15 LAB — CBC WITH DIFFERENTIAL/PLATELET
Eosinophil %: 3.9 %
HCT: 28 % — ABNORMAL LOW (ref 40.0–52.0)
Lymphocyte #: 0.7 10*3/uL — ABNORMAL LOW (ref 1.0–3.6)
MCH: 33 pg (ref 26.0–34.0)
MCV: 98 fL (ref 80–100)
Monocyte #: 0.7 10*3/uL (ref 0.0–0.7)
Monocyte %: 7.7 %
Neutrophil #: 7.9 10*3/uL — ABNORMAL HIGH (ref 1.4–6.5)
Platelet: 151 10*3/uL (ref 150–440)
RBC: 2.85 10*6/uL — ABNORMAL LOW (ref 4.40–5.90)
RDW: 13.8 % (ref 11.5–14.5)
WBC: 9.7 10*3/uL (ref 3.8–10.6)

## 2011-06-15 LAB — BASIC METABOLIC PANEL
BUN: 29 mg/dL — ABNORMAL HIGH (ref 7–18)
Chloride: 108 mmol/L — ABNORMAL HIGH (ref 98–107)
Osmolality: 304 (ref 275–301)
Potassium: 3.4 mmol/L — ABNORMAL LOW (ref 3.5–5.1)

## 2011-06-15 LAB — CLOSTRIDIUM DIFFICILE BY PCR

## 2011-06-16 LAB — BASIC METABOLIC PANEL
Calcium, Total: 8.1 mg/dL — ABNORMAL LOW (ref 8.5–10.1)
Chloride: 111 mmol/L — ABNORMAL HIGH (ref 98–107)
Co2: 24 mmol/L (ref 21–32)
Creatinine: 1.7 mg/dL — ABNORMAL HIGH (ref 0.60–1.30)
Potassium: 4.2 mmol/L (ref 3.5–5.1)
Sodium: 145 mmol/L (ref 136–145)

## 2011-06-17 LAB — CBC WITH DIFFERENTIAL/PLATELET
Basophil %: 0.5 %
Eosinophil %: 5.9 %
HCT: 28.9 % — ABNORMAL LOW (ref 40.0–52.0)
HGB: 9.7 g/dL — ABNORMAL LOW (ref 13.0–18.0)
Lymphocyte #: 0.8 10*3/uL — ABNORMAL LOW (ref 1.0–3.6)
MCV: 99 fL (ref 80–100)
Monocyte %: 10.3 %
Neutrophil #: 5.1 10*3/uL (ref 1.4–6.5)
RBC: 2.91 10*6/uL — ABNORMAL LOW (ref 4.40–5.90)
WBC: 7.1 10*3/uL (ref 3.8–10.6)

## 2011-06-17 LAB — BASIC METABOLIC PANEL
Chloride: 107 mmol/L (ref 98–107)
Co2: 27 mmol/L (ref 21–32)
Glucose: 148 mg/dL — ABNORMAL HIGH (ref 65–99)
Potassium: 3.8 mmol/L (ref 3.5–5.1)
Sodium: 146 mmol/L — ABNORMAL HIGH (ref 136–145)

## 2011-07-25 ENCOUNTER — Inpatient Hospital Stay: Payer: Self-pay | Admitting: Internal Medicine

## 2011-07-25 LAB — COMPREHENSIVE METABOLIC PANEL
Alkaline Phosphatase: 69 U/L (ref 50–136)
BUN: 30 mg/dL — ABNORMAL HIGH (ref 7–18)
Bilirubin,Total: 0.5 mg/dL (ref 0.2–1.0)
Calcium, Total: 8.1 mg/dL — ABNORMAL LOW (ref 8.5–10.1)
Chloride: 102 mmol/L (ref 98–107)
EGFR (Non-African Amer.): 49 — ABNORMAL LOW
Glucose: 110 mg/dL — ABNORMAL HIGH (ref 65–99)
Potassium: 3.4 mmol/L — ABNORMAL LOW (ref 3.5–5.1)
Sodium: 142 mmol/L (ref 136–145)

## 2011-07-25 LAB — CBC
Platelet: 156 10*3/uL (ref 150–440)
RDW: 14.4 % (ref 11.5–14.5)

## 2011-07-25 LAB — TROPONIN I: Troponin-I: 0.08 ng/mL — ABNORMAL HIGH

## 2011-07-26 LAB — BASIC METABOLIC PANEL
Anion Gap: 7 (ref 7–16)
BUN: 28 mg/dL — ABNORMAL HIGH (ref 7–18)
Calcium, Total: 7.7 mg/dL — ABNORMAL LOW (ref 8.5–10.1)
Co2: 31 mmol/L (ref 21–32)
Creatinine: 1.41 mg/dL — ABNORMAL HIGH (ref 0.60–1.30)
EGFR (African American): >60
Osmolality: 294 (ref 275–301)
Sodium: 143 mmol/L (ref 136–145)

## 2011-07-26 LAB — CBC WITH DIFFERENTIAL/PLATELET
Basophil #: 0 10*3/uL (ref 0.0–0.1)
Eosinophil #: 0.2 10*3/uL (ref 0.0–0.7)
Eosinophil %: 2.6 %
HCT: 29.8 % — ABNORMAL LOW (ref 40.0–52.0)
HGB: 9.8 g/dL — ABNORMAL LOW (ref 13.0–18.0)
Lymphocyte %: 10.9 %
MCH: 32.4 pg (ref 26.0–34.0)
MCV: 98 fL (ref 80–100)
Monocyte %: 9 %
Neutrophil #: 5.9 10*3/uL (ref 1.4–6.5)
Platelet: 137 10*3/uL — ABNORMAL LOW (ref 150–440)
RBC: 3.03 10*6/uL — ABNORMAL LOW (ref 4.40–5.90)
WBC: 7.7 10*3/uL (ref 3.8–10.6)

## 2011-07-28 LAB — COMPREHENSIVE METABOLIC PANEL
Alkaline Phosphatase: 71 U/L (ref 50–136)
Anion Gap: 9 (ref 7–16)
EGFR (African American): 57 — ABNORMAL LOW
EGFR (Non-African Amer.): 47 — ABNORMAL LOW
Glucose: 170 mg/dL — ABNORMAL HIGH (ref 65–99)
SGOT(AST): 21 U/L (ref 15–37)
Sodium: 142 mmol/L (ref 136–145)
Total Protein: 6.3 g/dL — ABNORMAL LOW (ref 6.4–8.2)

## 2011-07-28 LAB — CBC
HCT: 30.9 % — ABNORMAL LOW (ref 40.0–52.0)
MCH: 32.8 pg (ref 26.0–34.0)
MCHC: 33.5 g/dL (ref 32.0–36.0)
MCV: 98 fL (ref 80–100)
RBC: 3.16 10*6/uL — ABNORMAL LOW (ref 4.40–5.90)
RDW: 14.7 % — ABNORMAL HIGH (ref 11.5–14.5)
WBC: 10.8 10*3/uL — ABNORMAL HIGH (ref 3.8–10.6)

## 2011-07-29 ENCOUNTER — Observation Stay: Payer: Self-pay | Admitting: Internal Medicine

## 2011-09-20 DEATH — deceased

## 2012-11-15 IMAGING — CR DG CHEST 1V PORT
1 series · 1 of 1 positions shown · non-contrast
Comparison: Portable exam 1532 hours without priors for comparison.

CLINICAL DATA: Decreased level of consciousness, history diabetes,
dementia/Alzheimer's, hypertension

PORTABLE CHEST - 1 VIEW

[view not recorded]
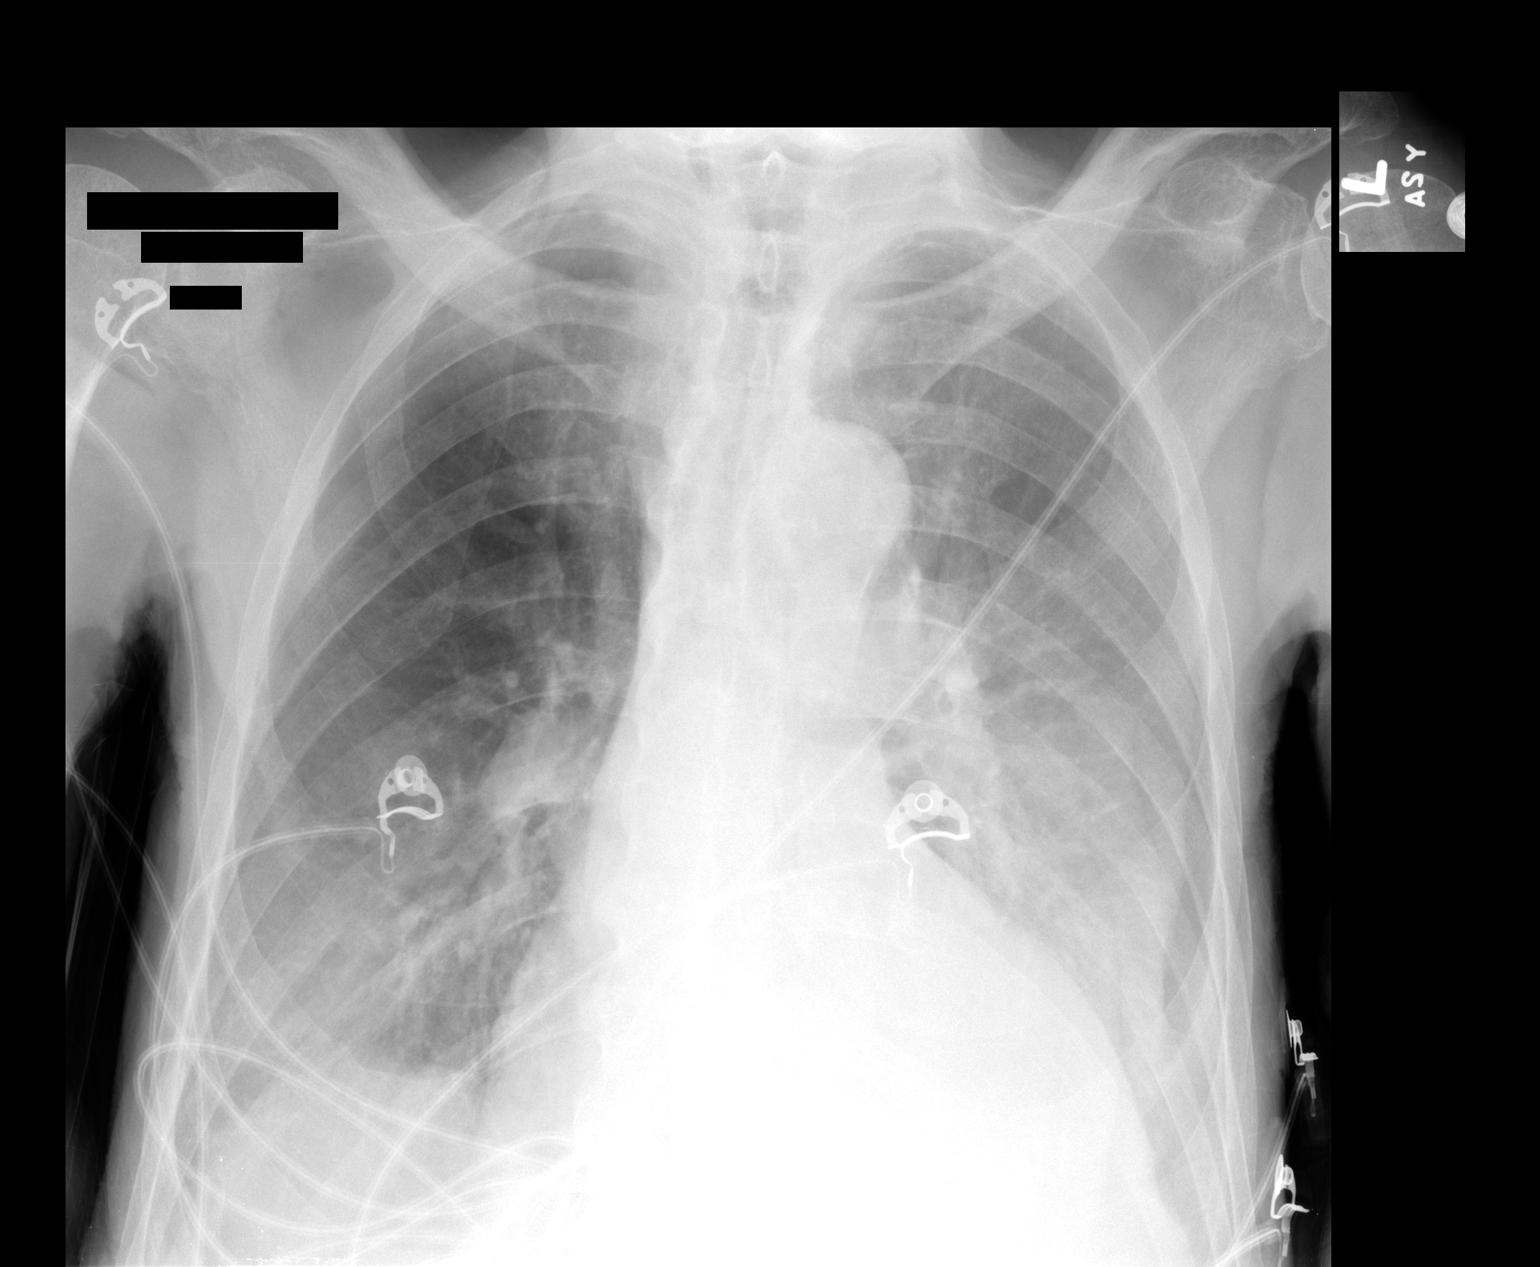

[1 of 1 positions shown; findings below may reference images not displayed]

FINDINGS: Enlargement of cardiac silhouette with pulmonary vascular
congestion.
Probable bilateral layered pleural effusions.
Atelectasis versus consolidation left lower lobe.
Potential mild atelectasis right base as well.
No pneumothorax.
Bones demineralized.
IMPRESSION: Enlargement of cardiac silhouette with pulmonary vascular
congestion.
Bibasilar effusions with atelectasis versus consolidation in left
lower lobe and probable mild right basilar atelectasis.

## 2012-12-01 IMAGING — CR DG CHEST 1V
1 series · 1 of 1 positions shown · non-contrast
Comparison: none

REASON FOR EXAM: ams
COMMENTS:

PROCEDURE:     DXR - DXR CHEST 1 VIEWAP OR PA  - June 11, 2011  [DATE]
RESULT:     Comparison: [id number]

[x chest ap]
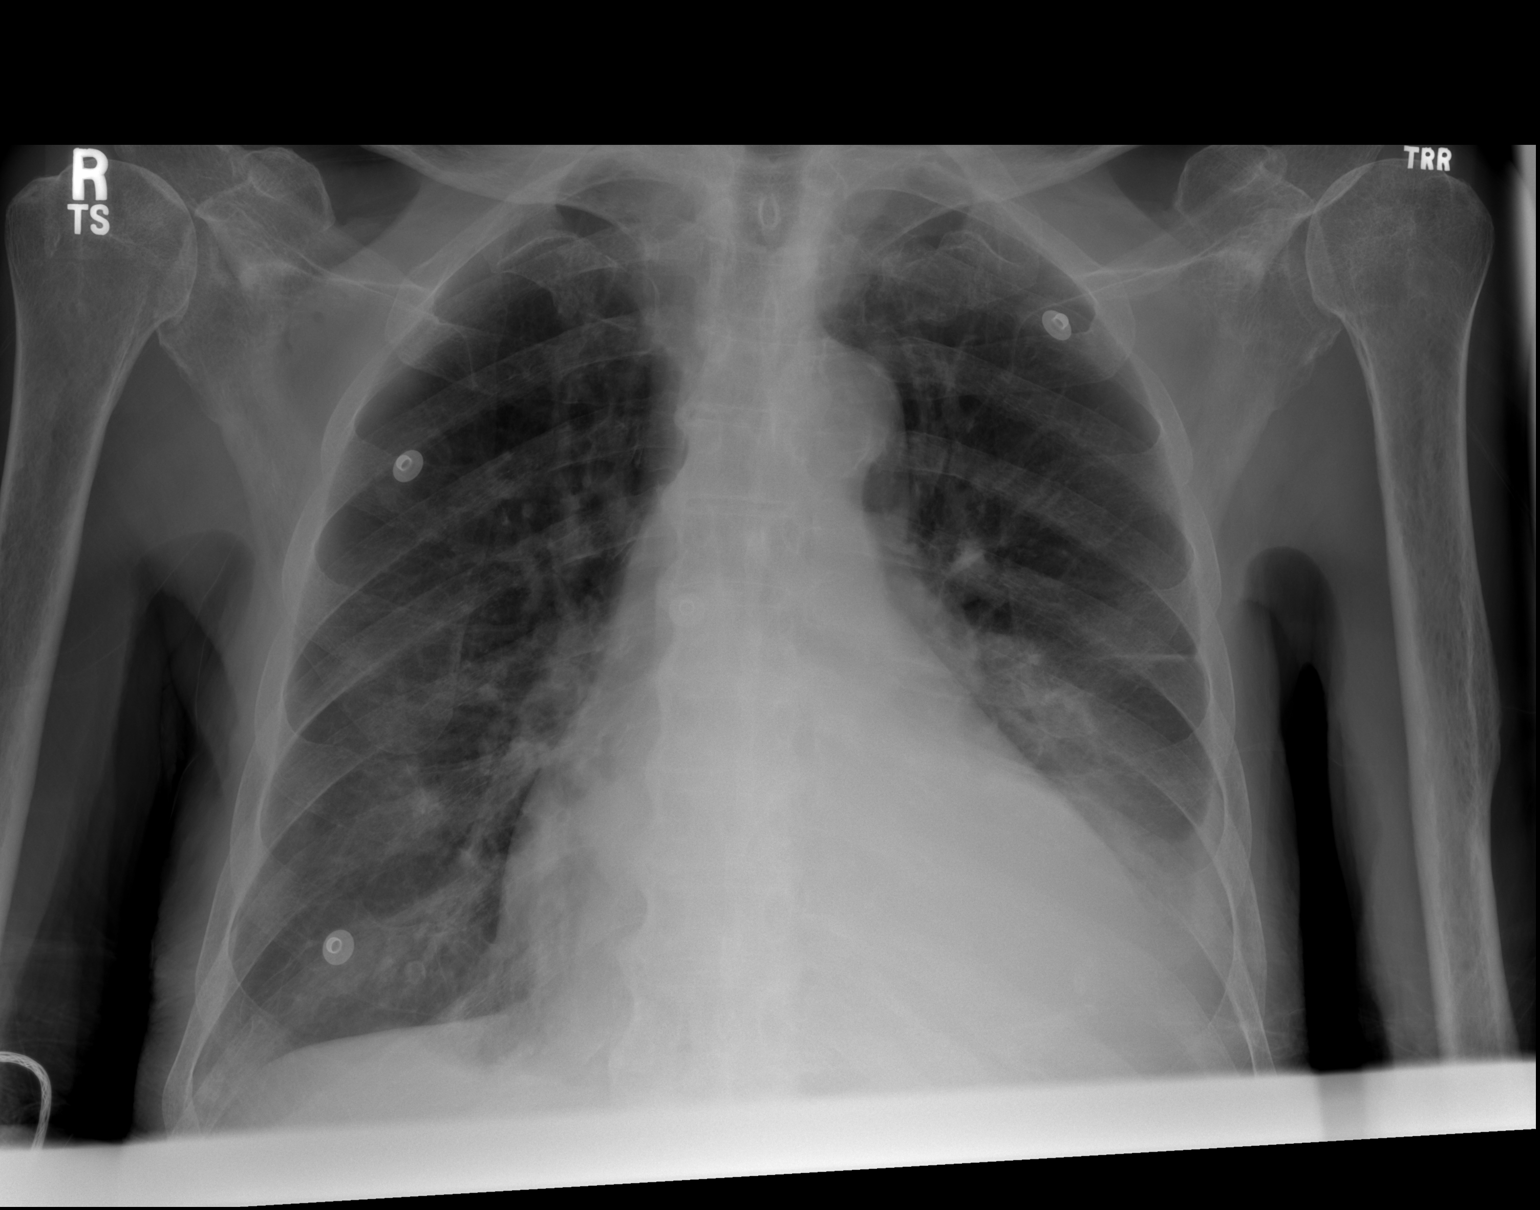

[1 of 1 positions shown; findings below may reference images not displayed]

FINDINGS: Single portable AP chest radiograph is provided. Small left pleural
effusion. There is no focal parenchymal opacity, right pleural effusion, or
pneumothorax. The heart is enlarged.. The osseous structures are
unremarkable.
IMPRESSION: No acute disease of the che[REDACTED]

## 2012-12-02 IMAGING — CR DG CHEST 1V PORT
1 series · 2 of 2 positions shown · non-contrast
Comparison: none

REASON FOR EXAM: ams
COMMENTS:

[Series 2: x chest ap · 0.14mm/px · 2 of 2 slices shown]
[im 1/2]
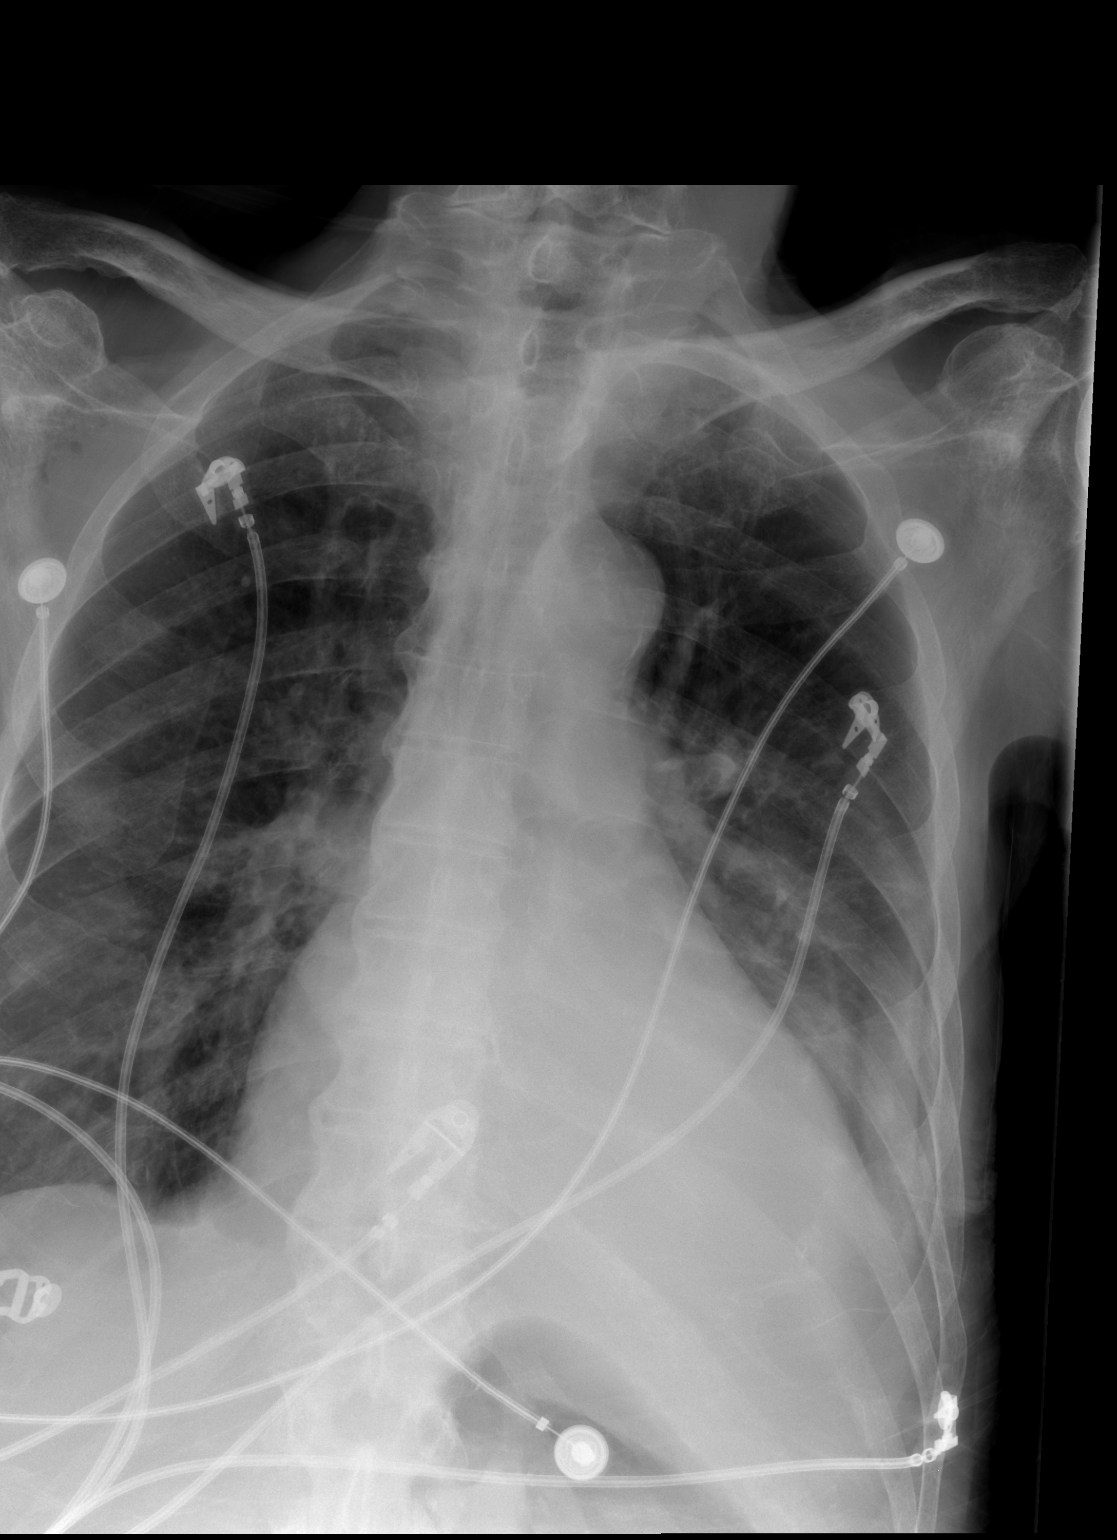
[im 2/2]
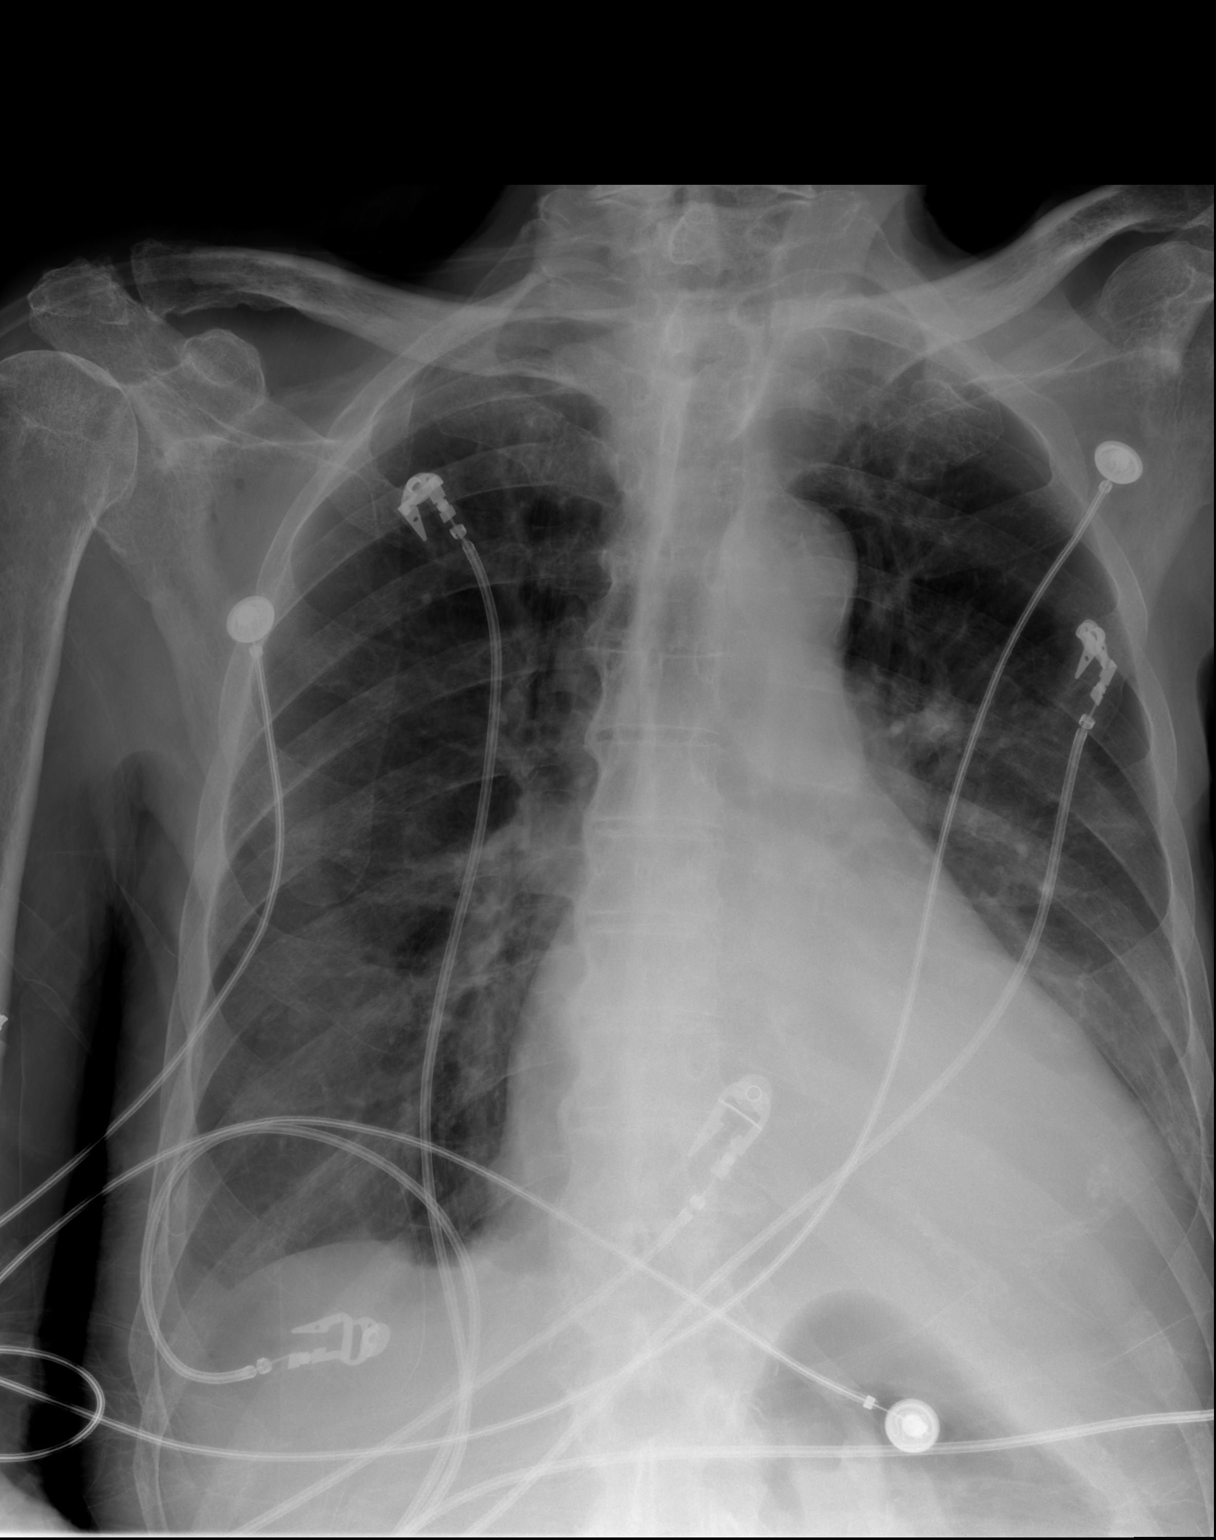

[2 of 2 positions shown; findings below may reference images not displayed]

PROCEDURE:     DXR - DXR PORTABLE CHEST SINGLE VIEW  - June 12, 2011  [DATE]

RESULT:     Comparison is made to the prior exam of 06/11/2011. There is mild
prominence of the pulmonary vascularity which suggests pulmonary vascular
congestion as might be seen with low grade CHF, renal insufficiency or an
elevated state of hydration. There is blunting of the right costophrenic
angle compatible with a trace right effusion. There is noted chronic
haziness at the left lung base, unchanged as compared to multiple prior
examinations. The heart is mildly enlarged. The lungs appear hyperinflated
compatible with a history of COPD or asthma.
IMPRESSION: 1. There are minimal changes of pulmonary vascular congestion.
2. There is a trace right pleural effusion.
3. There is chronic haziness at the left base.
4. Stable cardiomegaly.
5. The lungs appear bilaterally hyperinflated.

## 2013-01-17 IMAGING — CR DG CHEST 1V PORT
1 series · 2 of 2 positions shown · non-contrast
Comparison: none

REASON FOR EXAM: ams
COMMENTS:

[Series 1: ap · 0.17mm/px · 2 of 2 slices shown]
[im 1/2]
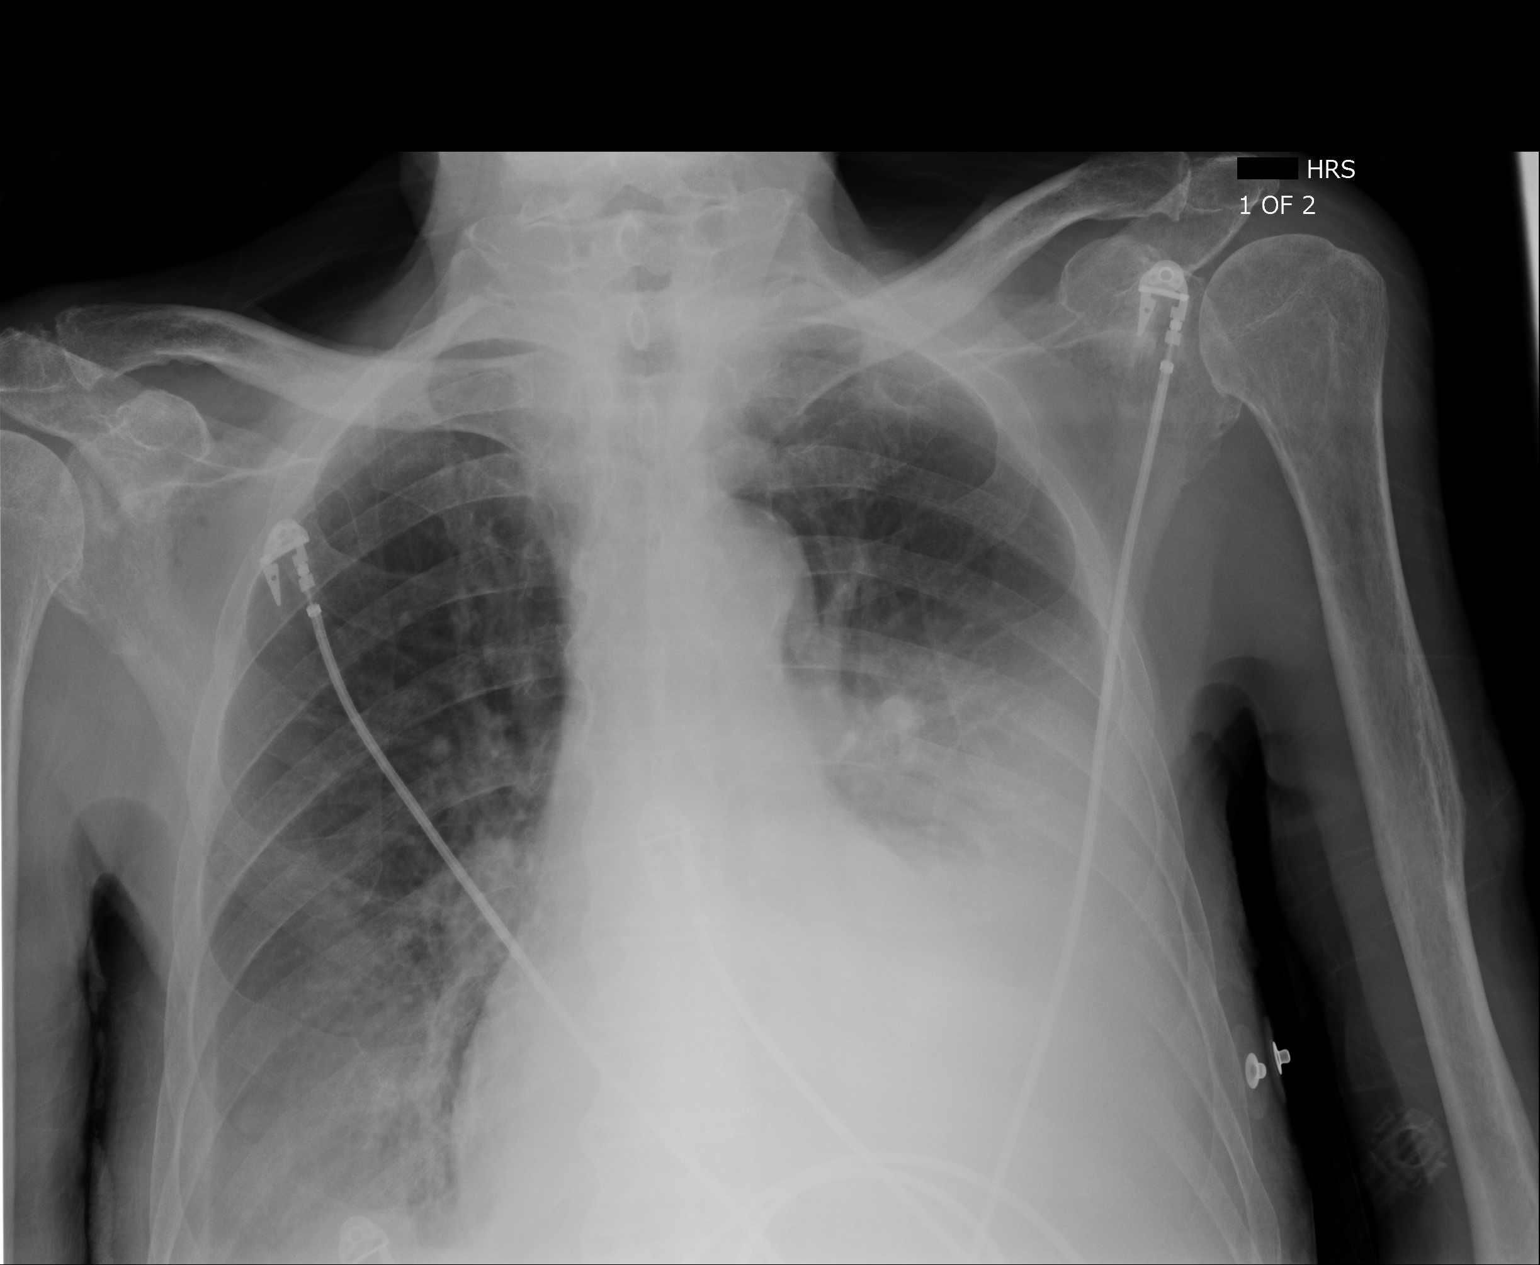
[im 2/2]
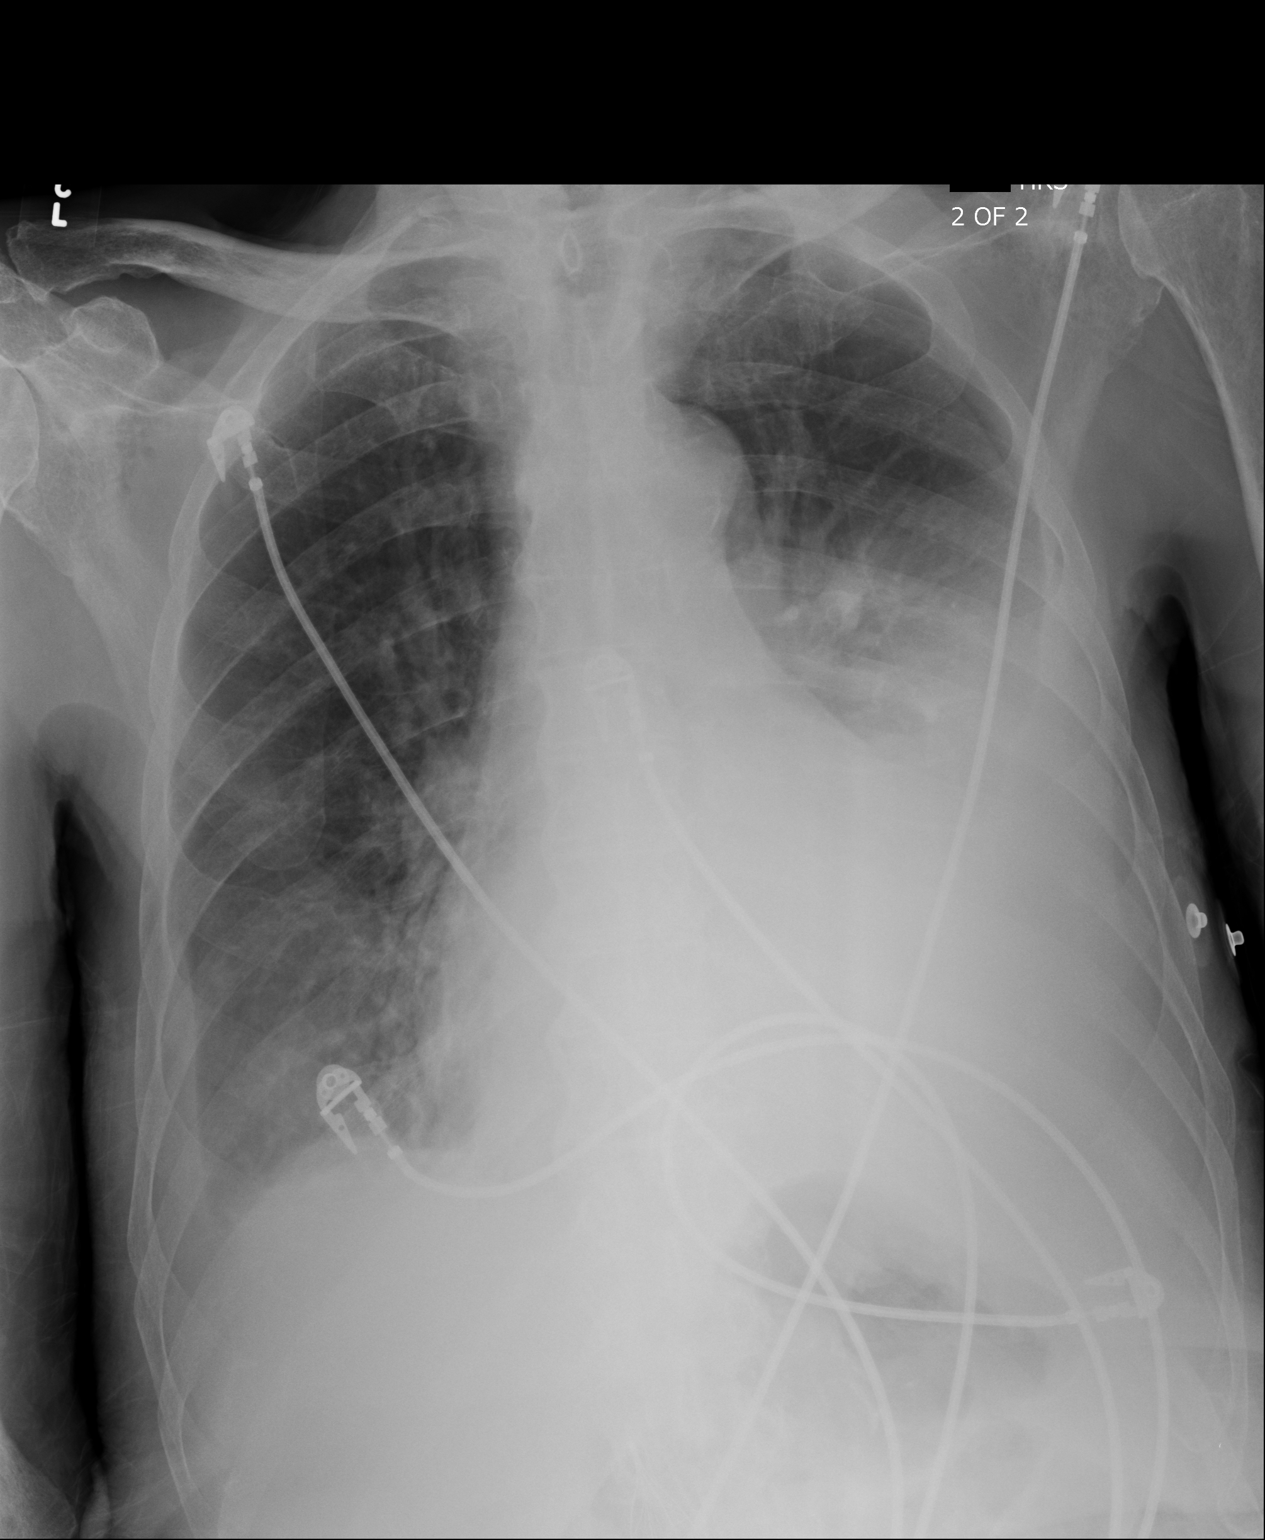

[2 of 2 positions shown; findings below may reference images not displayed]

PROCEDURE:     DXR - DXR PORTABLE CHEST SINGLE VIEW  - July 28, 2011  [DATE]

RESULT:

Diffuse increased density projects within the left lower lobe and lingular
regions with a consolidative component more inferiorly. There is thickening
of the interstitial markings and peribronchial cuffing. The cardiac
silhouette is enlarged. The visualized bony skeleton is unremarkable.
IMPRESSION: 1. Atelectasis versus infiltrate within the left lung base is likely a
component of pleural effusion.

## 2014-08-13 NOTE — H&P (Signed)
PATIENT NAME:  Jared Miranda, Jared Miranda MR#:  [id number] DATE OF BIRTH:  March 06, 1919  DATE OF ADMISSION:  06/12/2011  PRIMARY CARE PHYSICIAN: Dr. Frazier Richards  CHIEF COMPLAINT: Stopped responding and altered mental status.   HISTORY OF PRESENT ILLNESS: This is a 81 year old man who yesterday stopped responding at home with the caretakers after lunch. Didn't respond and around 4:00 p.m. after he still was not responding he was brought into the Emergency Room. He was diagnosed with a urinary tract infection on a positive urinalysis and given some Cipro. He was fed some oatmeal overnight. This a.m. again not responsive, not communicating. With the patient's history of dementia he is normally able to talk but he is sometimes quiet, sometimes he does not recognize his family, sometimes he does not make sense with talking, he does not walk. In the ER today he was found to be hypoxic. Chest x-ray showed haziness in the left base of the lung. CT scan of the head today showed no acute intracranial process. White blood cell count 10.9, hemoglobin and hematocrit 11.3 and 33.9, platelet count 172. Chemistry still pending.   PAST MEDICAL HISTORY:  1. Diabetes.  2. Hypertension.  3. Dementia.  4. Stroke.  5. Atrial fibrillation. 6. Lymphoma treated with chemotherapy, radiation and surgery.   PAST SURGICAL HISTORY:  1. Mouth surgery with lymphoma.  2. Hernia surgery.   ALLERGIES: No known drug allergies listed in the computer but as per a prior record he was allergic to aspirin and Plavix secondary to previous hemorrhagic strokes. I think he was held off on aspirin and Plavix because of hemorrhagic stroke rather than an allergy.   SOCIAL HISTORY: No smoking. No alcohol. No drug use. Was a car salesman in the past. Lives with his wife with full-time 24/7 care for both him and his wife.   FAMILY HISTORY: Father died at 17 of a cerebrovascular accident. Mother died at 78 of childbirth complications.   REVIEW  OF SYSTEMS: Unable to elicit secondary to dementia and altered mental status.   PHYSICAL EXAMINATION:  VITAL SIGNS: Vital signs on presentation included: Pulse 75, respirations 20, blood pressure 263/132, pulse oximetry 88% on room air.   GENERAL: No respiratory distress, moving arms in bed.   EYES: Conjunctivae and lids normal. Pupils equal, round, and reactive to light. Unable to test extraocular muscles.   EARS, NOSE, MOUTH, AND THROAT: Tympanic membranes blocked by wax. Nasal mucosa, no erythema. Throat unable to look into mouth, refuses to open mouth.   NECK: No JVD. No bruits. No lymphadenopathy. No thyromegaly. Patient cachectic.   RESPIRATORY: Lungs clear to auscultation but difficult exam secondary to altered mental status, unable to listen posteriorly.   CARDIOVASCULAR: S1, S2 normal. 2/6 systolic ejection murmur. Carotid upstroke 2+ bilaterally. No bruits. Dorsalis pedis pulses 1+ bilaterally. No edema of the lower extremity.   ABDOMEN: Soft, nontender. No organomegaly/splenomegaly. Normoactive bowel sounds. No masses felt.   LYMPHATIC: No lymph nodes in the neck.   MUSCULOSKELETAL: No clubbing. No edema. No cyanosis on oxygen.   SKIN: Chronic lower extremity discoloration. Unable to examine posteriorly by myself.   NEUROLOGIC: Patient opens eyes to sternal rub, is moving upper extremities on his own, able to squeeze my hand, not to command but on his own, unable to wiggle toes to command. Babinski upgoing bilaterally.   PSYCHIATRIC: Unable to test secondary to altered mental status.   LABORATORY, DIAGNOSTIC, AND RADIOLOGICAL DATA: EKG: Flipped T waves laterally. ABG showed pH 7.41, pCO2 50,  pO2 65, bicarbonate 31.7, oxygen saturation 96. Chest x-ray: Haziness at the left base. CT scan of the head: No acute intracranial process. White blood cell count 10.9, hemoglobin and hematocrit 11.3 and 33.9, platelet count 172. Chemistry pending.   ASSESSMENT AND PLAN:  1. Acute  respiratory failure with pulse oximetry of 88% on room air. Will give oxygen supplementation.  2. Encephalopathy with possible aspiration pneumonia. Will give IV Zosyn. Blood cultures x2. Will add Zithromax just in case community-acquired pneumonia.  3. Malignant hypertension. Will continue the patient's Toprol if able to take. Will give a nitro patch. Will put back on Norvasc since he was previously taking.  4. Diabetes. Will put on sliding scale and hold glipizide at this point.  5. Dementia. Continue Namenda. Will get a swallow evaluation and put on dysphagia diet at this point.  6. History of hemorrhagic stroke in the past. Will hold off on aspirin at this point.  7. CODE STATUS: Patient is a DO NOT RESUSCITATE. Can consider hospice services if does not get back to baseline.   TIME SPENT ON ADMISSION: 55 minutes.   ____________________________ Tana Conch. Leslye Peer, MD rjw:cms D: 06/12/2011 11:11:11 ET T: 06/12/2011 11:31:43 ET JOB#: [id number]  cc: Tana Conch. Leslye Peer, MD, <Dictator> Ocie Cornfield. Ouida Sills, MD  Marisue Brooklyn MD ELECTRONICALLY SIGNED 06/13/2011 16:04

## 2014-08-13 NOTE — H&P (Signed)
PATIENT NAME:  Jared Miranda, Jared Miranda MR#:  [id number] DATE OF BIRTH:  [birth year]  DATE OF ADMISSION:  07/29/2011  PRIMARY CARE PHYSICIAN:  Dr. Frazier Richards   HISTORY OF PRESENT ILLNESS: The patient was discharged today and came back to the hospital with a fall over. He was sitting in a chair. He was caught prior to falling to the floor. His eyes rolled back into his head. The CNA caught him and they called 911.  On the day prior to the last admission he had a similar episode where he did not breathe. The CNA did a Heimlich and he brought up some yogurt. He was admitted for aspiration pneumonia and suspected stroke. Hospitalist services were contacted for further evaluation. Family deferred CT scanning in the Emergency Room. The ER physician brought up the possibility of Hospice, which they are interested in. The care manager advised to bring him into the hospital and set that up here during the hospital stay.   PAST MEDICAL HISTORY:  1. Lymphoma status post chemotherapy and radiation therapy.  2. Atrial fibrillation.  3. Previous stroke.  4. Vascular dementia.  5. Hypertension.  6. Diabetes.  7. Aspiration pneumonia.  8. Chronic dysphagia.   PAST SURGICAL HISTORY:  1. Hernia.  2. Lymphoma surgery.   ALLERGIES: No known drug allergies.   MEDICATIONS: The patient was discharged on: 1. Tylenol as needed.  2. Glipizide 2.5 mg daily.  3. Namenda 10 mg twice a day.  4. Metoprolol tartrate 50 mg daily.  5. Multivitamin daily.  6. Augmentin 875 mg twice a day for five days.   SOCIAL HISTORY: No smoking. No alcohol. No drug use. Used to work in Leisure centre manager.   FAMILY HISTORY: Mother died of childbirth complications. Father died at 68 of a cerebrovascular accident.  REVIEW OF SYSTEMS: Unable to obtain.   PHYSICAL EXAMINATION:  VITAL SIGNS: Temperature 97.1, pulse 86, respirations 14, blood pressure 206/98, pulse oximetry 96%.   GENERAL: No respiratory distress. The patient is alert and  looking at you, unable to answer most questions, unable to follow many commands.   EYES: Conjunctivae normal. Lids normal. Pupils equal, round, and reactive to light.   EARS, NOSE, MOUTH, AND THROAT: Tympanic membranes no erythema. Nasal mucosa no erythema. Throat unable to examine, will not open his mouth. Lips normal.   NECK: No JVD. No bruits. No lymphadenopathy. No thyromegaly. No thyroid nodules palpated.   RESPIRATORY: Lungs had decreased breath sounds bilaterally. Poor respiratory effort. Scattered rhonchi at the bases.   HEART: S1, S2 irregularly irregular. Carotid upstroke 2+ bilaterally. Dorsalis pedis pulses 1+ bilaterally. Trace edema of the lower extremities and hands.   ABDOMEN: Soft, nontender. No organomegaly/splenomegaly. Normoactive bowel sounds. No masses felt.   LYMPHATIC: No lymph nodes in the neck.   MUSCULOSKELETAL: Trace edema of the lower extremity and hands. No clubbing. No cyanosis.   SKIN: Unable to examine posteriorly. Blood coming from the right third toe. Bruising on the arms.   NEUROLOGICAL: Difficult to test secondary to dementia. Not following commands. The patient does withdraw to painful stimuli. Babinski positive bilaterally. Able to move his arms on his own, but not to command.   PSYCHIATRIC: Unable to test.   LABORATORY AND RADIOLOGICAL DATA: White blood cell count 10.8, hemoglobin and hematocrit 10.4 and 30.9, platelet count 142, glucose 170, BUN 23, creatinine 1.49, sodium 142, potassium 3.8, chloride 105, CO2 28, calcium 8.2. Liver function tests: Albumin low at 2.9. Troponin borderline at 0.11. Chest x-ray: Haziness in  the left lower lobe   ASSESSMENT AND PLAN:  1. Syncope: The patient recently discharged today. We will bring him in as an observation. We will not do any further testing. The family is interested in Hospice home care. We will get a care manager to set up Hospice at home. We will continue to monitor clinically.  2. Malignant  hypertension:  We will try to give metoprolol stat and will give nitro paste. Continue to monitor closely.  3. Diabetes: We will put on sliding scale and hold glipizide at this point until he shows he is able to swallow.  4. History of a cerebrovascular accident: We will give aspirin. We will get a swallow evaluation. Currently on dysphagia diet.  5. Recent aspiration pneumonia on Augmentin: We will switch to Zosyn while in the hospital.  6. Dementia: On Namenda.  7. Chronic kidney disease: Creatinine stable from yesterday.  8. Thrombocytopenia: Platelet count stable from yesterday   In speaking with the family, son and wife at the bedside, the ER physician, and care manager, we will bring the patient in for an observation. Hopefully be able to set up home Hospice to try to keep him out of the hospital.   CODE STATUS: The patient is a DO NOT RESUSCITATE.      TIME SPENT ON ADMISSION: 50 minutes.   ____________________________ Tana Conch. Leslye Peer, MD rjw:bjt D: 07/28/2011 22:53:41 ET T: 07/29/2011 06:55:34 ET JOB#: [id number]  cc: Tana Conch. Leslye Peer, MD, <Dictator> Ocie Cornfield. Ouida Sills, MD Marisue Brooklyn MD ELECTRONICALLY SIGNED 08/01/2011 16:21

## 2014-08-13 NOTE — Discharge Summary (Signed)
PATIENT NAME:  Jared Miranda, Jared Miranda MR#:  [id number] DATE OF BIRTH:  1918-06-22  DATE OF ADMISSION:  06/12/2011 DATE OF DISCHARGE:  06/17/2011  DISCHARGE DIAGNOSES:  1. Aspiration pneumonia.  2. Encephalopathy complicating dementia.  3. Hypernatremia complicating above.  4. Dysphagia.  5. Hypertension, reasonably controlled presently.   DISCHARGE MEDICATIONS INCLUDE HIS USUAL HOME MEDICATIONS: 1. Acetaminophen p.r.n.  2. Glipizide 2.5 mg daily. 3. Memantine, which is Namenda, 10 mg b.i.d.  4. Metoprolol 50 mg daily.  5. Augmentin 875 mg b.i.d. x7 days. This is a new medicine for aspiration pneumonia.   He will go home on free water protocol as well to help increase his fluid intake.   HISTORY AND PHYSICAL: Please see history and physical done on admission.   HOSPITAL COURSE: The patient was admitted confused with some cough and congestion, started on antibiotics, Ensure, and he was on thickened liquids. He slowly improved. His white count came down nicely, normal at 7100 this morning. Coag-negative only in his urine was seen. There was some haziness at the left base and a right effusion. His Augmentin will be finished at home. He will continue on his oxygen at 2 liters until he can be titrated off safely. He has home health involved, multiple sitters at home with he and his wife and family checking on him carefully. He will return there at his and his family's wish.  TIME SPENT: It took approximately 35 minutes to do discharge tasks.   ____________________________ Ocie Cornfield. Ouida Sills, MD mwa:drc D: 06/17/2011 08:19:43 ET T: 06/17/2011 16:41:58 ET JOB#: [id number] Kirk Ruths MD ELECTRONICALLY SIGNED 06/18/2011 13:13

## 2014-08-13 NOTE — H&P (Signed)
PATIENT NAME:  Jared Miranda, Jared Miranda MR#:  [id number] DATE OF BIRTH:  03-Sep-1918  DATE OF ADMISSION:  07/25/2011  REFERRING PHYSICIAN: Dr Beather Arbour.    FAMILY PHYSICIAN: Dr. Frazier Richards.   REASON FOR ADMISSION: Acute altered mental status associated with respiratory distress.   HISTORY OF PRESENT ILLNESS: The patient is a 81 year old male followed by Dr. Ouida Sills with a history of previous stroke, dementia, aspiration pneumonia, and diabetes who presents to the emergency room after an acute episode of unresponsiveness while eating subsequently followed by choking. In the emergency room, the patient was noted to have a left facial droop and was essentially unresponsive except for some muttering sounds. Initial head CT was negative. Chest x-ray suggested pneumonia, which was presumably aspiration at the time of choking. He is now admitted for further evaluation. The patient is unable to give a history.   PAST MEDICAL HISTORY:  1. History of lymphoma, status post chemotherapy and radiation therapy.  2. History of atrial fibrillation.  3. Previous stroke.  4. Vascular dementia.  5. Benign hypertension.  6. Type 2 diabetes.  7. History of aspiration pneumonia.  8. History of encephalopathy.  9. Chronic dysphasia.   MEDICATIONS ON ADMISSION:  1. Multivitamin 1 p.o. daily.  2. Lopressor 50 mg p.o. daily.  3. Namenda 10 mg p.o. b.i.d.  4. Glipizide 2.5 mg p.o. daily.   ALLERGIES: No known drug allergies.   SOCIAL HISTORY: Negative for alcohol or tobacco abuse.   FAMILY HISTORY: Positive for stroke.   REVIEW OF SYSTEMS: Unable to obtain from patient.   PHYSICAL EXAMINATION:  GENERAL: The patient is acutely ill appearing, in mild distress.   VITAL SIGNS: Vital signs are remarkable for a blood pressure of 184/78 with a heart rate of 67 and a respiratory rate of 16.   HEENT: Normocephalic, atraumatic. Pupils equally round and reactive to light and accommodation. Extraocular movements are  intact. Sclerae are nonicteric. Conjunctivae are clear. Oropharynx is clear.   NECK: Supple without jugular venous distention or bruits. No adenopathy or thyromegaly is noted.   LUNGS: Scattered rhonchi with no wheezes or rales.   CARDIAC: Regular rate and rhythm with normal S1 and S2. There is a 2/6 systolic ejection murmur noted. No rubs or gallops present.   ABDOMEN: Soft, nontender with normoactive bowel sounds. No organomegaly or masses were appreciated. No hernias or bruits were noted.   EXTREMITIES: Without clubbing, cyanosis, or edema. Pulses were 1+ bilaterally.   SKIN: Warm and dry without rash or lesions. Stasis dermatitis was noted.   NEUROLOGICAL: Exam revealed a left facial droop with right lower extremity weakness.    PSYCHIATRIC: Exam was unable to be performed due to the patient's mental status.   LABORATORY DATA:  Head CT revealed chronic changes with no acute intracranial process. Prothrombin time was 14.3 with an INR of 1.1. CBC was remarkable for a white count of 8.4 with a hemoglobin of 11.3. Chemistries revealed a glucose of 110 with a BUN of 30 and creatinine 1.43 with a potassium of 3.4.   ASSESSMENT:  1. Altered mental status.  2. Presumed stroke with left facial droop.  3. Aspiration pneumonia.  4. Renal insufficiency.  5. Hypokalemia.  6. Benign hypertension.  7. Underlying vascular dementia.   PLAN:  The patient will be admitted to telemetry as a NO CODE BLUE, DO NOT RESUSCITATE according to the son's wishes. Apparently, he is a DO NOT RESUSCITATE at home. He will be started on aspirin at this time despite  his history of hemorrhagic stroke. We will send blood cultures and begin IV antibiotics. We will supplement oxygen and wean as tolerated. We will supplement potassium at this time as well. Followup routine labs in the morning. We will obtain an MRI of the brain and carotid Doppler's because of his acute stroke syndrome. We will use topical nitrates for  blood pressure control. Followup chest x-ray in the morning. We will consult Physical Therapy, Speech Therapy, and Care Management. We will keep n.p.o. for the time being. Further treatment and evaluation will depend upon the patient's progress.   TOTAL TIME SPENT ON THIS PATIENT: 50 minutes.    ____________________________ Leonie Douglas Doy Hutching, MD jds:vtd D: 07/25/2011 21:43:55 ET T: 07/26/2011 07:24:08 ET JOB#: [id number]  cc: Leonie Douglas. Doy Hutching, MD, <Dictator> Ocie Cornfield. Ouida Sills, MD Gweneth Fredlund Lennice Sites MD ELECTRONICALLY SIGNED 07/26/2011 8:50

## 2014-08-13 NOTE — Discharge Summary (Signed)
PATIENT NAME:  Jared Miranda, Jared Miranda MR#:  [id number] DATE OF BIRTH:  06/26/1918  DATE OF ADMISSION:  07/25/2011 DATE OF DISCHARGE:  07/28/2011  DISCHARGE DIAGNOSES:  1. Stroke with left-sided weakness.  2. Aspiration, doubt pneumonia but cannot rule out.  3. Dementia, severe.  4. Dysphagia and is tolerating medications.  5. History of hypertension, labile here, usually controlled at home.   DISCHARGE MEDICATIONS:  1. Augmentin 875 p.o. twice a day for a week.  2. Metoprolol XL 50 mg daily.  3. Glipizide 2.5 mg daily.  4. Tylenol p.r.n.  5. Namenda 10 mg b.i.d.  6. Multivitamin daily.   HISTORY AND PHYSICAL: Please see detailed history and physical done on admission.   HOSPITAL COURSE: Patient was admitted with confusion, left-sided weakness, facial droop after slumping over his chair at home. He was given IV fluids. He slowly improved close to if not at his baseline, able to eat some of his meals. He has sitter 24/7 at home. Elicited home health to follow him up more. Given his confusion general state after discussion with his son did not pursue further MRI and/or carotid ultrasound as further information would really not affect his treatment status. They were very realistic and understanding about the situation. He has possible retrocardiac opacity on chest x-ray which was likely atelectasis, again could not completely rule out aspiration with his dysphagia so we are treating with Augmentin as noted. His pulse oximetry is 97% on room air on the 6th. Had a creatinine 1.41, which goes along with his dehydration and mild acute renal failure. Otherwise will institute his usual plans at home and ensure home health follows up on the recent exacerbation of stroke as noted.       TIME SPENT: It took approximately 35 minutes to do discharge tasks today.   ____________________________ Ocie Cornfield. Ouida Sills, MD mwa:cms D: 07/28/2011 08:03:56 ET T: 07/29/2011 09:43:17  ET JOB#: [id number]  cc: Ocie Cornfield. Ouida Sills, MD, <Dictator> Kirk Ruths MD ELECTRONICALLY SIGNED 07/29/2011 15:12

## 2019-11-11 ENCOUNTER — Telehealth: Payer: Self-pay

## 2019-11-11 ENCOUNTER — Other Ambulatory Visit: Payer: Self-pay

## 2019-11-11 ENCOUNTER — Other Ambulatory Visit: Payer: Medicare Other

## 2019-11-11 DIAGNOSIS — Z515 Encounter for palliative care: Secondary | ICD-10-CM

## 2019-11-11 NOTE — Progress Notes (Signed)
COMMUNITY PALLIATIVE CARE SW NOTE  PATIENT NAME: Jared Miranda. DOB: August 04, 1918 MRN: [id number]  PRIMARY CARE PROVIDER: No primary care provider on file.  RESPONSIBLE PARTY:  Acct ID - Guarantor Home Phone Work Phone Relationship Acct Type  [id number] GREYCEN, FELTER* [phone]  Self P/F     [address], [place], [place] [address]     PLAN OF CARE and INTERVENTIONS:             1. GOALS OF CARE/ ADVANCE CARE PLANNING:  Patient is DNR. Patients daughter Debe Coder and son Jacorion have joint HCPOA. Patients goal is to remain in the home and avoid hospitalizations. 2. SOCIAL/EMOTIONAL/SPIRITUAL ASSESSMENT/ INTERVENTIONS:  SW and RN met with patient and patients daughter in patients home, friend of family, Hal Hope, present as well. Patient sitting at dining room table in [facility]. SW not present for first half of visit. Patient has good appetite. Family did not present any concerns to SW during short time present. Patient was content and stable during visit. SW reviewed care plan with family, no changes. Palliative care will continue to follow. 3. PATIENT/CAREGIVER EDUCATION/ COPING:  Patient is alert and oriented to self with advanced dementia. Patient pleasantly confused. No behaviors or concerns. Family is supportive. 4. PERSONAL EMERGENCY PLAN:  Family to call 911 for emergencies.  5. COMMUNITY RESOURCES COORDINATION/ HEALTH CARE NAVIGATION:  Family manages care. Patient's friend of family, Candace assist with care needs as well.  6. FINANCIAL/LEGAL CONCERNS/INTERVENTIONS:  None     SOCIAL HX:  Social History   Tobacco Use  . Smoking status: Former Smoker    Types: Cigars, Pipe  Substance Use Topics  . Alcohol use: Yes    Comment: social drinker, nothing in 2 yrs    CODE STATUS: DNR  ADVANCED DIRECTIVES: Y MOST FORM COMPLETE:  N HOSPICE EDUCATION PROVIDED: N  PPS: Patient requires assistance with ADLS except eating, but require encouragement.    Time Spent: 30 min   [place], Dana

## 2020-01-18 ENCOUNTER — Telehealth: Payer: Self-pay

## 2020-01-18 NOTE — Telephone Encounter (Signed)
Palliative care SW outreached patient to complete telephonic visit. Daughter, Debe Coder, provided update on medical condition and/or changes. No pain reported. No recent falls. Patient is eating well and weight is stable patient has caregiver M-F who checks vital signs, per daughter vitals have been stable. No medication changes. Patient sleeping well. Patient continues to be WC bound. Daughter states that patient is doing very well. Daughter appreciative of telephonic check in. Palliative care will continue to monitor and assist with long term care planning as needed.

## 2020-04-17 ENCOUNTER — Telehealth: Payer: Self-pay

## 2020-04-17 NOTE — Telephone Encounter (Signed)
10:20AM: Palliative care SW outreached patient to complete telephonic visit.   Palliative care SW outreached patient to complete telephonic visit. Patient's daughter, Wyn Forster,  provided update on medical condition and/or changes. Patient had seizure Nov 18th and was admitted to the hospital. Depakote was increased. Family was encouraged to give Ativan if patient has a seizure for longer than 10 min. Patient has visited PCP and neuro since hospitalization all labs are within normal range. Family educated by providers that seizure activity is a part of disease progress. Patient is doing well at this time. No current pain reported. No recent falls. Patient's appetite is really good, and has gained weight. Patient is having good bowel movements, does take stool softener at nighttime. No financial, food insecurities or issues with transportation noted. Patient continues to have caregiver M-F.  No other psychosocial needs. Daughter appreciative of telephonic check in. In home visit discussed, family is leery due to rise in recent COVID cases, will outreach at later date to schedule in home visit or conduct telephonic check in, per patient and family comfortability.   Palliative care will continue to monitor and assist with long term care planning as needed.

## 2020-06-12 ENCOUNTER — Telehealth: Payer: Self-pay

## 2020-06-12 DIAGNOSIS — Z515 Encounter for palliative care: Secondary | ICD-10-CM

## 2020-06-12 NOTE — Telephone Encounter (Signed)
10:30AM: Palliative care SW outreached patient to complete telephonic visit.   Palliative care SW outreached patient to complete telephonic visit. Patient's daughter Debe Coder provided update on medical condition and/or changes. Daughter shared that patient contracted COVID in Jan 2022. Daughter shared that patient recovered fairly quickly and did not have invasive symptoms. Daughter shares that patient is back at his baseline. No recent falls. Patient's appetite is the same with weight changes. No pain noted. No medication changes. Patient continues having good bowel movements, does take stool softener at nighttime. Patient has upcoming appointment with PCP on 3/25 and neurologist 3/15. Patient denies financial, food insecurities or issues with transportation. No other psychosocial needs. Daughter appreciative of telephonic check in and would like to continue monthly telephonic calls unless otherwise needed. SW discussed palliative care services and when a visit could benefit. Daughter shared that she with hospitalist when patient was in the hospital a few months and agreed that little to no hospital visits would be best for patient moving forward. MOST to be discussed during visit.  Palliative care will continue to monitor and assist with long term care planning as needed.

## 2020-07-17 ENCOUNTER — Other Ambulatory Visit: Payer: Self-pay

## 2020-07-17 ENCOUNTER — Other Ambulatory Visit: Payer: Medicare Other
